# Patient Record
Sex: Female | Born: 1940 | Race: Black or African American | Hispanic: No | Marital: Single | Smoking: Former smoker
Health system: Southern US, Community
[De-identification: ages and names within clinical notes are randomized; demographics above are authoritative.]

## PROBLEM LIST (undated history)

## (undated) DIAGNOSIS — R7303 Prediabetes: Secondary | ICD-10-CM

## (undated) DIAGNOSIS — I10 Essential (primary) hypertension: Secondary | ICD-10-CM

## (undated) DIAGNOSIS — E785 Hyperlipidemia, unspecified: Secondary | ICD-10-CM

## (undated) DIAGNOSIS — R0600 Dyspnea, unspecified: Secondary | ICD-10-CM

---

## 1988-10-27 DIAGNOSIS — D860 Sarcoidosis of lung: Secondary | ICD-10-CM

## 1988-10-27 HISTORY — DX: Sarcoidosis of lung: D86.0

## 1993-10-27 HISTORY — PX: KNEE ARTHROSCOPY: SHX127

## 1998-07-29 ENCOUNTER — Emergency Department (HOSPITAL_COMMUNITY): Admission: EM | Admit: 1998-07-29 | Discharge: 1998-07-30 | Payer: Self-pay | Admitting: Emergency Medicine

## 1998-10-10 ENCOUNTER — Other Ambulatory Visit: Admission: RE | Admit: 1998-10-10 | Discharge: 1998-10-10 | Payer: Self-pay | Admitting: Otolaryngology

## 1999-05-24 ENCOUNTER — Other Ambulatory Visit: Admission: RE | Admit: 1999-05-24 | Discharge: 1999-05-24 | Payer: Self-pay | Admitting: *Deleted

## 2001-05-06 ENCOUNTER — Other Ambulatory Visit: Admission: RE | Admit: 2001-05-06 | Discharge: 2001-05-06 | Payer: Self-pay | Admitting: *Deleted

## 2001-05-11 ENCOUNTER — Encounter: Admission: RE | Admit: 2001-05-11 | Discharge: 2001-05-11 | Payer: Self-pay | Admitting: *Deleted

## 2001-05-11 ENCOUNTER — Encounter: Payer: Self-pay | Admitting: *Deleted

## 2001-09-20 ENCOUNTER — Encounter: Admission: RE | Admit: 2001-09-20 | Discharge: 2001-09-20 | Payer: Self-pay | Admitting: *Deleted

## 2001-09-20 ENCOUNTER — Encounter: Payer: Self-pay | Admitting: *Deleted

## 2003-05-23 ENCOUNTER — Encounter: Admission: RE | Admit: 2003-05-23 | Discharge: 2003-05-23 | Payer: Self-pay

## 2003-06-11 ENCOUNTER — Encounter: Admission: RE | Admit: 2003-06-11 | Discharge: 2003-06-11 | Payer: Self-pay

## 2003-06-29 ENCOUNTER — Encounter: Admission: RE | Admit: 2003-06-29 | Discharge: 2003-08-30 | Payer: Self-pay | Admitting: Orthopedic Surgery

## 2003-07-07 ENCOUNTER — Ambulatory Visit (HOSPITAL_COMMUNITY): Admission: RE | Admit: 2003-07-07 | Discharge: 2003-07-07 | Payer: Self-pay | Admitting: Gastroenterology

## 2003-07-07 ENCOUNTER — Encounter (INDEPENDENT_AMBULATORY_CARE_PROVIDER_SITE_OTHER): Payer: Self-pay | Admitting: Specialist

## 2003-08-31 ENCOUNTER — Encounter: Admission: RE | Admit: 2003-08-31 | Discharge: 2003-09-08 | Payer: Self-pay | Admitting: Surgery

## 2003-09-04 ENCOUNTER — Encounter (INDEPENDENT_AMBULATORY_CARE_PROVIDER_SITE_OTHER): Payer: Self-pay

## 2003-09-04 ENCOUNTER — Inpatient Hospital Stay (HOSPITAL_COMMUNITY): Admission: RE | Admit: 2003-09-04 | Discharge: 2003-09-08 | Payer: Self-pay | Admitting: Surgery

## 2004-12-10 ENCOUNTER — Ambulatory Visit (HOSPITAL_COMMUNITY): Admission: RE | Admit: 2004-12-10 | Discharge: 2004-12-10 | Payer: Self-pay | Admitting: Gastroenterology

## 2005-03-11 ENCOUNTER — Encounter: Admission: RE | Admit: 2005-03-11 | Discharge: 2005-03-11 | Payer: Self-pay | Admitting: Internal Medicine

## 2005-03-27 ENCOUNTER — Encounter: Admission: RE | Admit: 2005-03-27 | Discharge: 2005-06-25 | Payer: Self-pay | Admitting: Orthopedic Surgery

## 2005-08-08 ENCOUNTER — Encounter: Admission: RE | Admit: 2005-08-08 | Discharge: 2005-08-08 | Payer: Self-pay | Admitting: Internal Medicine

## 2006-11-30 ENCOUNTER — Encounter: Admission: RE | Admit: 2006-11-30 | Discharge: 2006-11-30 | Payer: Self-pay | Admitting: *Deleted

## 2007-07-25 ENCOUNTER — Encounter: Admission: RE | Admit: 2007-07-25 | Discharge: 2007-07-25 | Payer: Self-pay | Admitting: Orthopedic Surgery

## 2007-09-08 ENCOUNTER — Ambulatory Visit: Payer: Self-pay | Admitting: Vascular Surgery

## 2007-09-08 ENCOUNTER — Encounter (INDEPENDENT_AMBULATORY_CARE_PROVIDER_SITE_OTHER): Payer: Self-pay | Admitting: Orthopedic Surgery

## 2007-09-08 ENCOUNTER — Ambulatory Visit (HOSPITAL_COMMUNITY): Admission: RE | Admit: 2007-09-08 | Discharge: 2007-09-08 | Payer: Self-pay | Admitting: Orthopedic Surgery

## 2007-09-21 ENCOUNTER — Encounter: Admission: RE | Admit: 2007-09-21 | Discharge: 2007-10-19 | Payer: Self-pay | Admitting: Orthopedic Surgery

## 2010-05-21 ENCOUNTER — Encounter: Admission: RE | Admit: 2010-05-21 | Discharge: 2010-06-07 | Payer: Self-pay | Admitting: Sports Medicine

## 2010-08-15 ENCOUNTER — Encounter: Admission: RE | Admit: 2010-08-15 | Discharge: 2010-08-15 | Payer: Self-pay | Admitting: Sports Medicine

## 2011-03-14 NOTE — Op Note (Signed)
NAME:  Theresa Moses, Theresa Moses                            ACCOUNT NO.:  [id number]   MEDICAL RECORD NO.:  [id number]                   PATIENT TYPE:  AMB   LOCATION:  ENDO                                 FACILITY:  [facility]   PHYSICIAN:  John C. Madilyn Fireman, M.D.                 DATE OF BIRTH:  1941-01-23   DATE OF PROCEDURE:  07/07/2003  DATE OF DISCHARGE:                                 OPERATIVE REPORT   PROCEDURE:  Colonoscopy with biopsy.   INDICATION FOR PROCEDURE:  Colon cancer screening in a 70 year old patient  with no prior screening.   DESCRIPTION OF PROCEDURE:  The patient was placed in the left lateral  decubitus position and placed on the pulse monitor with continuous low-flow  oxygen delivered by nasal cannula.  She was sedated with 110 mcg IV fentanyl  and 11 mm IV Versed.  The Olympus video colonoscope was inserted into the  rectum and advanced to the cecum, confirmed by transillumination of  McBurney's point and visualization of the ileocecal valve and appendiceal  orifice.  The prep was good.  The cecum appeared normal with no masses,  polyps, diverticula, or other mucosal abnormalities.  The ileocecal valve  appeared somewhat granular and possibly nodular and looked to be possibly  infiltrated by adenomatous tissue.  Biopsies were taken and sent in the  first specimen container.  Approximately 5-7 cm distal to the ileocecal  valve there was a soft polypoid mass that was approximately 3 x 4 cm and no  stalk, with somewhat indistinct margin with the surrounding mucosa.  This  was biopsied and sent in a separate specimen container.  It was not hard and  my impression, it was probably an adenoma but it was possible that this  could harbor carcinoma, and it was unlikely to be removable endoscopically.  The remainder of the ascending, transverse, descending, and sigmoid colon  appeared normal with no further polyps, masses, diverticula, or other  mucosal abnormalities.  The rectum  likewise appeared normal, and retroflexed  view of the anus revealed no obvious internal hemorrhoids.  The scope was  then withdrawn and the patient returned to the recovery room in stable  condition.  She tolerated the procedure well, and there were no immediate  complications.   IMPRESSION:  1. Possible adenomatous infiltration of the ileocecal valve.  2. Ascending colon soft polypoid mass, probably not removable     endoscopically.   PLAN:  Await biopsy results and expect she will probably need a surgical  consultation for cecal and ascending colon resection.                                               John C. Madilyn Fireman, M.D.    JCH/MEDQ  D:  07/07/2003  T:  07/08/2003  Job:  [id number]   cc:   Christella Noa, M.D.  [address]  [place], [place] [address]  Fax: [phone]

## 2011-03-14 NOTE — Discharge Summary (Signed)
NAME:  Theresa Moses, Theresa Moses                            ACCOUNT NO.:  [id number]   MEDICAL RECORD NO.:  [id number]                   PATIENT TYPE:  INP   LOCATION:  0457                                 FACILITY:  [facility]   PHYSICIAN:  Velora Heckler, M.D.                DATE OF BIRTH:  October 26, 1941   DATE OF ADMISSION:  09/04/2003  DATE OF DISCHARGE:  09/08/2003                                 DISCHARGE SUMMARY   REASON FOR ADMISSION:  Colonic polyps.   HISTORY OF PRESENT ILLNESS:  Patient is a 70 year old black female who  underwent screening colonoscopy in September of 2004 by Dr. Dorena Cookey.  She  was found to have a 3 cm soft tissue mass in the mid ascending colon.  Biopsy showed tubulovillous adenoma.  This was not amenable to colonoscopic  resection.  Therefore, she was referred to surgery for right colectomy.   HOSPITAL COURSE:  The patient was admitted on September 04, 2003, and taken  directly to the operating room where she underwent exploratory laparotomy  and right colectomy.  Postoperative course was uncomplicated.  Patient  participated in a clinical trial from [employer] for a postoperative  ileus drug.  Patient started on clear liquids on the first postoperative  day.  She advanced to a regular diet.  She had a bowel movement on the third  postoperative day and was prepared for discharge home on the fourth  postoperative day.   DISCHARGE PLANNING:  Patient is discharged home on September 08, 2003 in good  condition, tolerating a regular diet, and ambulating independently.   DISCHARGE MEDICATIONS:  Vicodin as needed for pain.   Patient will be seen back at my office at [facility] in two  weeks.   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  Eight of eight lymph nodes are negative for metastatic disease.   CONDITION AT DISCHARGE:  Good.                                         Velora Heckler, M.D.    TMG/MEDQ  D:  10/06/2003  T:  10/06/2003  Job:  [id number]   cc:   Everardo All. Madilyn Fireman, M.D.  [address]  [place]  [place] [address]  Fax: [phone]

## 2011-03-14 NOTE — Op Note (Signed)
NAME:  Theresa Moses, Theresa Moses                            ACCOUNT NO.:  [id number]   MEDICAL RECORD NO.:  [id number]                   PATIENT TYPE:  INP   LOCATION:  Z610                                 FACILITY:  [facility]   PHYSICIAN:  Velora Heckler, M.D.                DATE OF BIRTH:  1941/07/23   DATE OF PROCEDURE:  09/04/2003  DATE OF DISCHARGE:                                 OPERATIVE REPORT   PREOPERATIVE DIAGNOSIS:  Adenomatous  polyps, right colon.   POSTOPERATIVE DIAGNOSIS:  Adenomatous  polyps, right colon.   PROCEDURE:  Right colectomy.   SURGEON:  Velora Heckler, M.D.   ASSISTANT:  Lebron Conners, M.D.   ANESTHESIA:  General.   ESTIMATED BLOOD LOSS:  Minimal.   PREPARATION:  Betadine.   COMPLICATIONS:  None.   INDICATIONS FOR PROCEDURE:  The patient is a 70 year old black female who  presents at the request of Dr. Dorena Cookey for polyps involving the right  colon. The patient had undergone a screening colonoscopy on July 07, 2003, by Dr. Dorena Cookey. This demonstrated a 3-cm soft tissue mass in the  mid ascending colon consistent on biopsy with tubulovillous adenoma. It was  too large for colonoscopic resection. The patient was also noted to have a  granular surface of the ileocecal valve. Biopsy showed adenomatous polyps.  The patient now comes to surgery for right colectomy.   DESCRIPTION OF PROCEDURE:  The procedure was done in operating room #1 at  the [facility]. The patient was brought to the operating  room and placed in the supine position on the operating table. Following  administration of general endotracheal anesthesia the patient was prepped  and draped in the usual strict aseptic fashion.   After ascertaining that an adequate level of anesthesia had been obtained, a  midline  abdominal incision was made with a #10 blade. Dissection was  carried down through the skin and subcutaneous tissues with the  electrocautery. The fascia  was incised in the midline and the peritoneal  cavity was entered cautiously.   The abdomen is explored. The viscera are grossly normal. A Balfour retractor  was placed for exposure. The right colon is then mobilized from its lateral  peritoneal attachments along the white line. Dissection is carried up and  around the hepatic flexure onto the mid transverse colon, allowing for  complete mobilization of the right colon. The cecum is mobilized from its  lateral  and inferior peritoneal attachments. The terminal ileum is densely  adherent to the right adnexa. It is mobilized with sharp dissection. The  ileum is then delivered up and  out of the pelvis.   A point in the terminal ileum is selected and transected with the GIA  stapler. A point in the proximal transverse colon is also selected and  transected with the GIA stapler. The  mesentery is  taken down between  clamps, divided and ligated with 2-0 silk ties. Good hemostasis is noted.   The specimen is passed off of the field and opened on the back table. The  ileocecal valve does have an irregular surface consistent with adenomatosis.  In the mid ascending colon there is a large, approximately 4-cm broad based  polypoid mass consistent with that described on endoscopy.   Next a side-to-side functionally end-to-end anastomosis is created between  the terminal ileum and the proximal transverse colon. The bowel is aligned  with interrupted 2-0 silk sutures. An enterotomy is made in each limb of the  bowel and a GIA stapler is inserted and fired. The staple line is inspected  for hemostasis. The enterotomy is closed with a TA-60 stapler. Good  integrity is noted around the staple lines with no evidence of leak. The  mesenteric defect is closed with interrupted 2-0 and 3-0 silk sutures. The  right upper quadrant and right abdomen  are irrigated copiously with warm  saline which is evacuated.   The viscera are returned to their normal  location. The anastomosis is  covered with omentum. The midline wound was closed with running #1 Novofil  suture. The subcutaneous tissue was irrigated and hemostasis was obtained  with electrocautery. The skin was closed with stainless steel staples.  Sterile dressings are applied.   The patient was awakened from anesthesia and brought to the recovery room in  stable condition. The patient tolerated the procedure well.                                               Velora Heckler, M.D.    TMG/MEDQ  D:  09/04/2003  T:  09/04/2003  Job:  [id number]   cc:   Everardo All. Madilyn Fireman, M.D.  [address]  [place]  [place] [address]  Fax: [phone]   Christella Noa, M.D.  [address]  [place], [place] [address]  Fax: [phone]   Feliberto Gottron. Turner Daniels, M.D.  [address]  [place]  [place] [address]  Fax: [phone]

## 2011-03-14 NOTE — Op Note (Signed)
NAMEGWENLYN, Theresa Moses                  ACCOUNT NO.:  [id number]   MEDICAL RECORD NO.:  [id number]          PATIENT TYPE:  AMB   LOCATION:  ENDO                         FACILITY:  [facility]   PHYSICIAN:  John C. Madilyn Fireman, M.D.    DATE OF BIRTH:  Jun 16, 1941   DATE OF PROCEDURE:  12/10/2004  DATE OF DISCHARGE:                                 OPERATIVE REPORT   PROCEDURE:  Colonoscopy.   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  Presents for surveillance.   PROCEDURE:  The patient was placed in the left lateral decubitus position  and placed on the pulse monitor with continuous low-flow oxygen delivered by  nasal cannula. She was sedated with 75 mcg IV fentanyl and 8 mg IV Versed.  The Olympus video colonoscope was inserted into the rectum and advanced to  the ileocolonic anastomosis and was confirmed by transillumination of the  right abdomen and clear visualization to the surgical anastomosis. The  anastomosis appeared intact and there were no suspicious lesions were seen.  The remaining portions of transverse well as the descending sigmoid and  rectum all appeared normal down to the anus, where retroflexed view revealed  no obvious internal hemorrhoids. The scope was then withdrawn, and the  patient returned to the recovery room in stable condition. She tolerated the  procedure well. There were no immediate complications.   IMPRESSION:  Normal colonoscopy, status post right hemicolectomy.   PLAN:  Repeat colonoscopy in 5 years.      JCH/MEDQ  D:  12/10/2004  T:  12/10/2004  Job:  [id number]   cc:   Velora Heckler, MD  [address] [place]  [place] [address]

## 2011-05-07 ENCOUNTER — Other Ambulatory Visit: Payer: Self-pay | Admitting: Otolaryngology

## 2011-05-07 ENCOUNTER — Other Ambulatory Visit (HOSPITAL_COMMUNITY)
Admission: RE | Admit: 2011-05-07 | Discharge: 2011-05-07 | Disposition: A | Discharge status: Home / Self Care | Payer: Medicare Other | Source: Ambulatory Visit | Attending: Otolaryngology | Admitting: Otolaryngology

## 2011-05-07 DIAGNOSIS — R220 Localized swelling, mass and lump, head: Secondary | ICD-10-CM | POA: Insufficient documentation

## 2011-05-07 DIAGNOSIS — R221 Localized swelling, mass and lump, neck: Secondary | ICD-10-CM | POA: Insufficient documentation

## 2011-08-12 ENCOUNTER — Other Ambulatory Visit: Payer: Self-pay | Admitting: Family Medicine

## 2011-08-12 ENCOUNTER — Ambulatory Visit
Admission: RE | Admit: 2011-08-12 | Discharge: 2011-08-12 | Disposition: A | Discharge status: Home / Self Care | Payer: Medicare Other | Source: Ambulatory Visit | Attending: Family Medicine | Admitting: Family Medicine

## 2011-08-12 DIAGNOSIS — M5410 Radiculopathy, site unspecified: Secondary | ICD-10-CM

## 2011-10-13 ENCOUNTER — Other Ambulatory Visit (INDEPENDENT_AMBULATORY_CARE_PROVIDER_SITE_OTHER): Payer: Self-pay | Admitting: Surgery

## 2011-10-14 NOTE — Telephone Encounter (Signed)
Patient seeking Rx for Miralax.  Last seen here 2008 for lipoma on back follow up.

## 2011-10-14 NOTE — Telephone Encounter (Signed)
Patient seeking Rx for Miralax.  Last seen here 2008 for lipoma on back.

## 2013-11-24 ENCOUNTER — Other Ambulatory Visit: Payer: Self-pay | Admitting: Family Medicine

## 2013-11-24 ENCOUNTER — Ambulatory Visit
Admission: RE | Admit: 2013-11-24 | Discharge: 2013-11-24 | Disposition: A | Discharge status: Home / Self Care | Payer: Medicare Other | Source: Ambulatory Visit | Attending: Family Medicine | Admitting: Family Medicine

## 2013-11-24 DIAGNOSIS — D869 Sarcoidosis, unspecified: Secondary | ICD-10-CM

## 2014-04-25 ENCOUNTER — Other Ambulatory Visit: Payer: Self-pay | Admitting: Family Medicine

## 2014-04-25 DIAGNOSIS — M79606 Pain in leg, unspecified: Secondary | ICD-10-CM

## 2014-04-27 ENCOUNTER — Ambulatory Visit
Admission: RE | Admit: 2014-04-27 | Discharge: 2014-04-27 | Disposition: A | Discharge status: Home / Self Care | Payer: Medicare Other | Source: Ambulatory Visit | Attending: Family Medicine | Admitting: Family Medicine

## 2014-04-27 DIAGNOSIS — M79606 Pain in leg, unspecified: Secondary | ICD-10-CM

## 2016-01-21 ENCOUNTER — Ambulatory Visit
Admission: RE | Admit: 2016-01-21 | Discharge: 2016-01-21 | Disposition: A | Discharge status: Home / Self Care | Payer: Commercial Managed Care - HMO | Source: Ambulatory Visit | Attending: Family Medicine | Admitting: Family Medicine

## 2016-01-21 ENCOUNTER — Other Ambulatory Visit: Payer: Self-pay | Admitting: Family Medicine

## 2016-01-21 DIAGNOSIS — R05 Cough: Secondary | ICD-10-CM

## 2016-01-21 DIAGNOSIS — R059 Cough, unspecified: Secondary | ICD-10-CM

## 2016-02-19 ENCOUNTER — Ambulatory Visit (INDEPENDENT_AMBULATORY_CARE_PROVIDER_SITE_OTHER): Payer: Commercial Managed Care - HMO | Admitting: Internal Medicine

## 2016-02-19 ENCOUNTER — Encounter: Payer: Self-pay | Admitting: Internal Medicine

## 2016-02-19 VITALS — BP 124/78 | HR 74 | Ht 65.5 in | Wt 235.8 lb

## 2016-02-19 DIAGNOSIS — I10 Essential (primary) hypertension: Secondary | ICD-10-CM

## 2016-02-19 DIAGNOSIS — R058 Other specified cough: Secondary | ICD-10-CM

## 2016-02-19 DIAGNOSIS — D869 Sarcoidosis, unspecified: Secondary | ICD-10-CM

## 2016-02-19 DIAGNOSIS — R05 Cough: Secondary | ICD-10-CM

## 2016-02-19 MED ORDER — VALSARTAN 160 MG PO TABS: 160.0000 mg | ORAL_TABLET | Freq: Every day | ORAL | Status: DC

## 2016-02-19 NOTE — Patient Instructions (Signed)
Stop enalapril and start valsartan 160 mg daily and your throat congestion should gradually resolve over next several weeks and gone completely in 6 weeks   If you are satisfied with your treatment plan,  let your doctor know and he/she can either refill your medications or you can return here when your prescription runs out.     If in any way you are not 100% satisfied,  please tell us.  If 100% better, tell your friends!  Pulmonary follow up is as needed

## 2016-02-19 NOTE — Assessment & Plan Note (Signed)
Dx 1980s and never treated - a good rule is that if not needing prednisone at presentation, then never will will chronic rx   Another  good rule of thumb is that >95% of pts with active sarcoid in any organ will have some plain cxr changes - on the other hand  if there are active pulmonary symptoms the cxr will look much worse than the patient:  No evidence of either scenario here/ strongly doubt active dz     Strongly doubt any of her present symptoms are related to sarcoid > f/u is prn

## 2016-02-19 NOTE — Assessment & Plan Note (Signed)
The most common causes of chronic cough in immunocompetent adults include the following: upper airway cough syndrome (UACS), previously referred to as postnasal drip syndrome (PNDS), which is caused by variety of rhinosinus conditions; (2) asthma; (3) GERD; (4) chronic bronchitis from cigarette smoking or other inhaled environmental irritants; (5) nonasthmatic eosinophilic bronchitis; and (6) bronchiectasis.   These conditions, singly or in combination, have accounted for up to 94% of the causes of chronic cough in prospective studies.   Other conditions have constituted no >6% of the causes in prospective studies These have included bronchogenic carcinoma, chronic interstitial pneumonia, sarcoidosis, left ventricular failure, ACEI-induced cough, and aspiration from a condition associated with pharyngeal dysfunction.    Chronic cough is often simultaneously caused by more than one condition. A single cause has been found from 38 to 82% of the time, multiple causes from 18 to 62%. Multiply caused cough has been the result of three diseases up to 42% of the time.       Based on hx and exam, this is most likely:  Classic Upper airway cough syndrome, so named because it's frequently impossible to sort out how much is  CR/sinusitis with freq throat clearing (which can be related to primary GERD)   vs  causing  secondary (" extra esophageal")  GERD from wide swings in gastric pressure that occur with throat clearing, often  promoting self use of mint and menthol lozenges that reduce the lower esophageal sphincter tone and exacerbate the problem further in a cyclical fashion.   These are the same pts (now being labeled as having "irritable larynx syndrome" by some cough centers) who not infrequently have a history of having failed to tolerate ace inhibitors,  dry powder inhalers or biphosphonates or report having atypical reflux symptoms that don't respond to standard doses of PPI , and are easily confused as  having aecopd or asthma flares by even experienced allergists/ pulmonologists.   The first step is to   and eliminate ACEi  then regroup in 6 weeks if the cough persists.  If this proves correct, then there may be an opportunity to simplify her meds further but defer this to Dr Fulp's capable hands

## 2016-02-19 NOTE — Progress Notes (Signed)
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 Pulmonary Consult    Referred by Dr. Cain Saupe for eval of Sarcoid. Pt states dxed with Sarcoid in the 80's- she can not recall how it was dxed. She c/o cough x 6 wks- prod with thick, white to yellow sputum.    acutely ill x 6 weeks with severe cough/ abrupt onset and improved p rx with abx / pred/ combivent but still lingering sense of throat and chest congestion while on vasotec chronically.  Symptoms are 24/7 s limiting sob at present  No obvious other patterns in day to day or daytime variabilty or assoc   cp or chest tightness, subjective wheeze overt sinus or hb symptoms. No unusual exp hx or h/o childhood pna/ asthma or knowledge of premature birth.  Sleeping ok without nocturnal  or early am exacerbation  of respiratory  c/o's or need for noct saba. Also denies any obvious fluctuation of symptoms with weather or environmental changes or other aggravating or alleviating factors except as outlined above   Current Medications, Allergies, Complete Past Medical History, Past Surgical History, Family History, and Social History were reviewed in [employer] record.           Review of Systems  Constitutional: Negative for fever, chills and unexpected weight change.  HENT: Positive for congestion. Negative for dental problem, ear pain, nosebleeds, postnasal drip, rhinorrhea, sinus pressure, sneezing, sore throat, trouble swallowing and voice change.   Eyes: Negative for visual disturbance.  Respiratory: Positive for cough. Negative for choking and  shortness of breath.   Cardiovascular: Negative for chest pain and leg swelling.  Gastrointestinal: Negative for vomiting, abdominal pain and diarrhea.  Genitourinary: Negative for difficulty urinating.  Musculoskeletal: Positive for arthralgias.  Skin: Negative for rash.  Neurological: Negative for tremors, syncope and headaches.  Hematological: Does not bruise/bleed easily.       Objective:   Physical Exam  amb bf nad/ mild pseudowheeze  Wt Readings from Last 3 Encounters:  02/19/16 235 lb 12.8 oz (106.958 kg)    Vital signs reviewed   HEENT: nl dentition, turbinates, and oropharynx. Nl external ear canals without cough reflex   NECK :  without JVD/Nodes/TM/ nl carotid upstrokes bilaterally   LUNGS: no acc muscle use,  Nl contour chest which is clear to A and P bilaterally without cough on insp or exp maneuvers   CV:  RRR  no s3 or murmur or increase in P2, no edema   ABD:  soft and nontender with nl inspiratory excursion in the supine position. No bruits or organomegaly, bowel sounds nl  MS:  Nl gait/ ext warm without deformities, calf tenderness, cyanosis or clubbing No obvious joint restrictions   SKIN: warm and dry without lesions    NEURO:  alert, approp, nl sensorium with  no motor deficits      I personally reviewed images and agree with radiology impression as follows:  CXR:  01/21/16 Cardiac shadow is stable. Some mild hilar fullness is again identified likely related to the patient's given clinical  history. Degenerative changes of thoracic spine are noted. No acute bony abnormality is seen         Assessment & Plan:

## 2016-02-19 NOTE — Assessment & Plan Note (Signed)
In the best review of chronic cough to date ( NEJM 2016 375 [id number]) ,  ACEi are now felt to cause cough in up to  20% of pts which is a 4 fold increase from previous reports and does not include the variety of non-specific complaints we see in pulmonary clinic in pts on ACEi but previously attributed to another dx like  Copd/asthma and  include PNDS, throat and chest congestion, "bronchitis", unexplained dyspnea and noct "strangling" sensations, and hoarseness, but also  atypical /refractory GERD symptoms like dysphagia and "bad heartburn"   The only way I know  to prove this is not an "ACEi Case" is a trial off ACEi x a minimum of 6 weeks then regroup.  Try diovan 160 mg daily and return here prn

## 2016-11-01 ENCOUNTER — Encounter (HOSPITAL_COMMUNITY): Payer: Self-pay | Admitting: Emergency Medicine

## 2016-11-01 ENCOUNTER — Emergency Department (HOSPITAL_COMMUNITY): Payer: Medicare Other

## 2016-11-01 ENCOUNTER — Emergency Department (HOSPITAL_COMMUNITY)
Admission: EM | Admit: 2016-11-01 | Discharge: 2016-11-01 | Disposition: A | Discharge status: Home / Self Care | Payer: Medicare Other | Attending: Emergency Medicine | Admitting: Emergency Medicine

## 2016-11-01 DIAGNOSIS — Z79899 Other long term (current) drug therapy: Secondary | ICD-10-CM | POA: Insufficient documentation

## 2016-11-01 DIAGNOSIS — Z91040 Latex allergy status: Secondary | ICD-10-CM | POA: Diagnosis not present

## 2016-11-01 DIAGNOSIS — R05 Cough: Secondary | ICD-10-CM | POA: Insufficient documentation

## 2016-11-01 DIAGNOSIS — I10 Essential (primary) hypertension: Secondary | ICD-10-CM | POA: Insufficient documentation

## 2016-11-01 DIAGNOSIS — Z7982 Long term (current) use of aspirin: Secondary | ICD-10-CM | POA: Insufficient documentation

## 2016-11-01 DIAGNOSIS — Z87891 Personal history of nicotine dependence: Secondary | ICD-10-CM | POA: Diagnosis not present

## 2016-11-01 DIAGNOSIS — R059 Cough, unspecified: Secondary | ICD-10-CM

## 2016-11-01 HISTORY — DX: Essential (primary) hypertension: I10

## 2016-11-01 MED ORDER — BENZONATATE 100 MG PO CAPS: 100.0000 mg | ORAL_CAPSULE | Freq: Three times a day (TID) | ORAL | 0 refills | Status: DC

## 2016-11-01 MED ORDER — AZITHROMYCIN 250 MG PO TABS: 250.0000 mg | ORAL_TABLET | Freq: Every day | ORAL | 0 refills | Status: DC

## 2016-11-01 NOTE — Discharge Instructions (Signed)
Only start the antibiotic if you are not better by tomorrow

## 2016-11-01 NOTE — ED Notes (Signed)
Patient was A & O x4.  She understood discharge instructions.   

## 2016-11-01 NOTE — ED Triage Notes (Signed)
Pt complaint of continued generalized abdominal pain, diaphoresis, and productive cough since Thursday. Pt denies n/v/d.

## 2016-11-01 NOTE — ED Provider Notes (Signed)
[facility]-EMERGENCY DEPT Provider Note   CSN: [id number] Arrival date & time: 11/01/16  0805     History   Chief Complaint Chief Complaint  Patient presents with  . Flu Like Symptoms    HPI Theresa Moses is a 76 y.o. female.  76 year old female presents with cough congestion 3 days. No associated vomiting or diarrhea. Some subjective fever. No urinary symptoms. No headache or neck pain. Has had some dyspnea on exertion but denies any chest pain or chest pressure. Has had positive sick exposures. No rashes. No recent travel history. Has used over-the-counter medications without relief.       Past Medical History:  Diagnosis Date  . Hypertension     Patient Active Problem List   Diagnosis Date Noted  . Upper airway cough syndrome 02/19/2016  . Essential hypertension 02/19/2016  . Sarcoidosis (HCC) 02/19/2016    History reviewed. No pertinent surgical history.  OB History    No data available       Home Medications    Prior to Admission medications   Medication Sig Start Date End Date Taking? Authorizing Provider  aspirin 81 MG tablet Take 81 mg by mouth daily.    Historical Provider, MD  atorvastatin (LIPITOR) 40 MG tablet Take 40 mg by mouth daily.    Historical Provider, MD  Calcium Carb-Cholecalciferol (CALCIUM PLUS VITAMIN D3 PO) Take 1 tablet by mouth daily.    Historical Provider, MD  cetirizine (ZYRTEC) 10 MG tablet Take 10 mg by mouth daily.    Historical Provider, MD  fluticasone (FLONASE) 50 MCG/ACT nasal spray Place 2 sprays into both nostrils daily as needed for allergies or rhinitis.    Historical Provider, MD  Garlic (GARLIQUE PO) Take 1 Can by mouth daily.    Historical Provider, MD  glucosamine-chondroitin 500-400 MG tablet Take 1 tablet by mouth daily.    Historical Provider, MD  hydrochlorothiazide (MICROZIDE) 12.5 MG capsule Take 12.5 mg by mouth daily.    Historical Provider, MD  Ipratropium-Albuterol (COMBIVENT RESPIMAT) 20-100 MCG/ACT AERS  respimat Inhale 1 puff into the lungs every 6 (six) hours as needed for wheezing.    Historical Provider, MD  montelukast (SINGULAIR) 10 MG tablet Take 10 mg by mouth at bedtime.    Historical Provider, MD  Multiple Vitamins-Minerals (CENTRUM SILVER PO) Take 1 tablet by mouth daily.    Historical Provider, MD  Omega-3 Fatty Acids (FISH OIL PO) Take 1 capsule by mouth daily.    Historical Provider, MD  valsartan (DIOVAN) 160 MG tablet Take 1 tablet (160 mg total) by mouth daily. 02/19/16   Nyoka Cowden, MD    Family History Family History  Problem Relation Age of Onset  . Breast cancer Sister     Social History Social History  Substance Use Topics  . Smoking status: Former Smoker    Packs/day: 0.25    Years: 6.00    Types: Cigarettes    Quit date: 10/28/1995  . Smokeless tobacco: Never Used  . Alcohol use No     Allergies   Latex and Neosporin [neomycin-polymyxin-gramicidin]   Review of Systems Review of Systems  All other systems reviewed and are negative.    Physical Exam Updated Vital Signs BP 111/84 (BP Location: Left Arm)   Pulse 100   Temp 97.9 F (36.6 C) (Oral)   Resp 20   Ht 5\' 5"  (1.651 m)   Wt 109.3 kg   SpO2 94%   BMI 40.10 kg/m   Physical Exam  Constitutional: She is oriented to person, place, and time. She appears well-developed and well-nourished.  Non-toxic appearance. No distress.  HENT:  Head: Normocephalic and atraumatic.  Eyes: Conjunctivae, EOM and lids are normal. Pupils are equal, round, and reactive to light.  Neck: Normal range of motion. Neck supple. No tracheal deviation present. No thyroid mass present.  Cardiovascular: Normal rate, regular rhythm and normal heart sounds.  Exam reveals no gallop.   No murmur heard. Pulmonary/Chest: Effort normal and breath sounds normal. No stridor. No respiratory distress. She has no decreased breath sounds. She has no wheezes. She has no rhonchi. She has no rales.  Abdominal: Soft. Normal  appearance and bowel sounds are normal. She exhibits no distension. There is no tenderness. There is no rebound and no CVA tenderness.  Musculoskeletal: Normal range of motion. She exhibits no edema or tenderness.  Neurological: She is alert and oriented to person, place, and time. She has normal strength. No cranial nerve deficit or sensory deficit. GCS eye subscore is 4. GCS verbal subscore is 5. GCS motor subscore is 6.  Skin: Skin is warm and dry. No abrasion and no rash noted.  Psychiatric: She has a normal mood and affect. Her speech is normal and behavior is normal.  Nursing note and vitals reviewed.    ED Treatments / Results  Labs (all labs ordered are listed, but only abnormal results are displayed) Labs Reviewed - No data to display  EKG  EKG Interpretation None       Radiology Dg Chest 2 View  Result Date: 11/01/2016 CLINICAL DATA:  Pt complaint of continued generalized abdominal pain, diaphoresis, and productive cough since Thursday. Pt denies n/v/d. Hx of asthma. EXAM: CHEST  2 VIEW COMPARISON:  01/21/2016 FINDINGS: The cardiac silhouette is normal in size. No mediastinal or hilar masses. No evidence of adenopathy. Clear lungs.  No pleural effusion or pneumothorax. Skeletal structures are demineralized but intact. IMPRESSION: No active cardiopulmonary disease. Electronically Signed   By: Amie Portlandavid  Ormond M.D.   On: 11/01/2016 08:30    Procedures Procedures (including critical care time)  Medications Ordered in ED Medications - No data to display   Initial Impression / Assessment and Plan / ED Course  I have reviewed the triage vital signs and the nursing notes.  Pertinent labs & imaging results that were available during my care of the patient were reviewed by me and considered in my medical decision making (see chart for details).  Clinical Course     Patient's chest x-ray is negative. Will give patient prescription for antibiotic in case she gets worse. Also  treat her cough with antitussives  Final Clinical Impressions(s) / ED Diagnoses   Final diagnoses:  None    New Prescriptions New Prescriptions   No medications on file     Lorre NickAnthony Karsyn Rochin, MD 11/01/16 [phone]

## 2018-01-11 IMAGING — CR DG CHEST 2V
2 series · 2 of 2 positions shown · non-contrast
Comparison: 01/21/2016

CLINICAL DATA: Pt complaint of continued generalized abdominal
pain, diaphoresis, and productive cough since [REDACTED]. Pt denies
n/v/d. Hx of asthma.

EXAM:
CHEST  2 VIEW

[w chest pa]
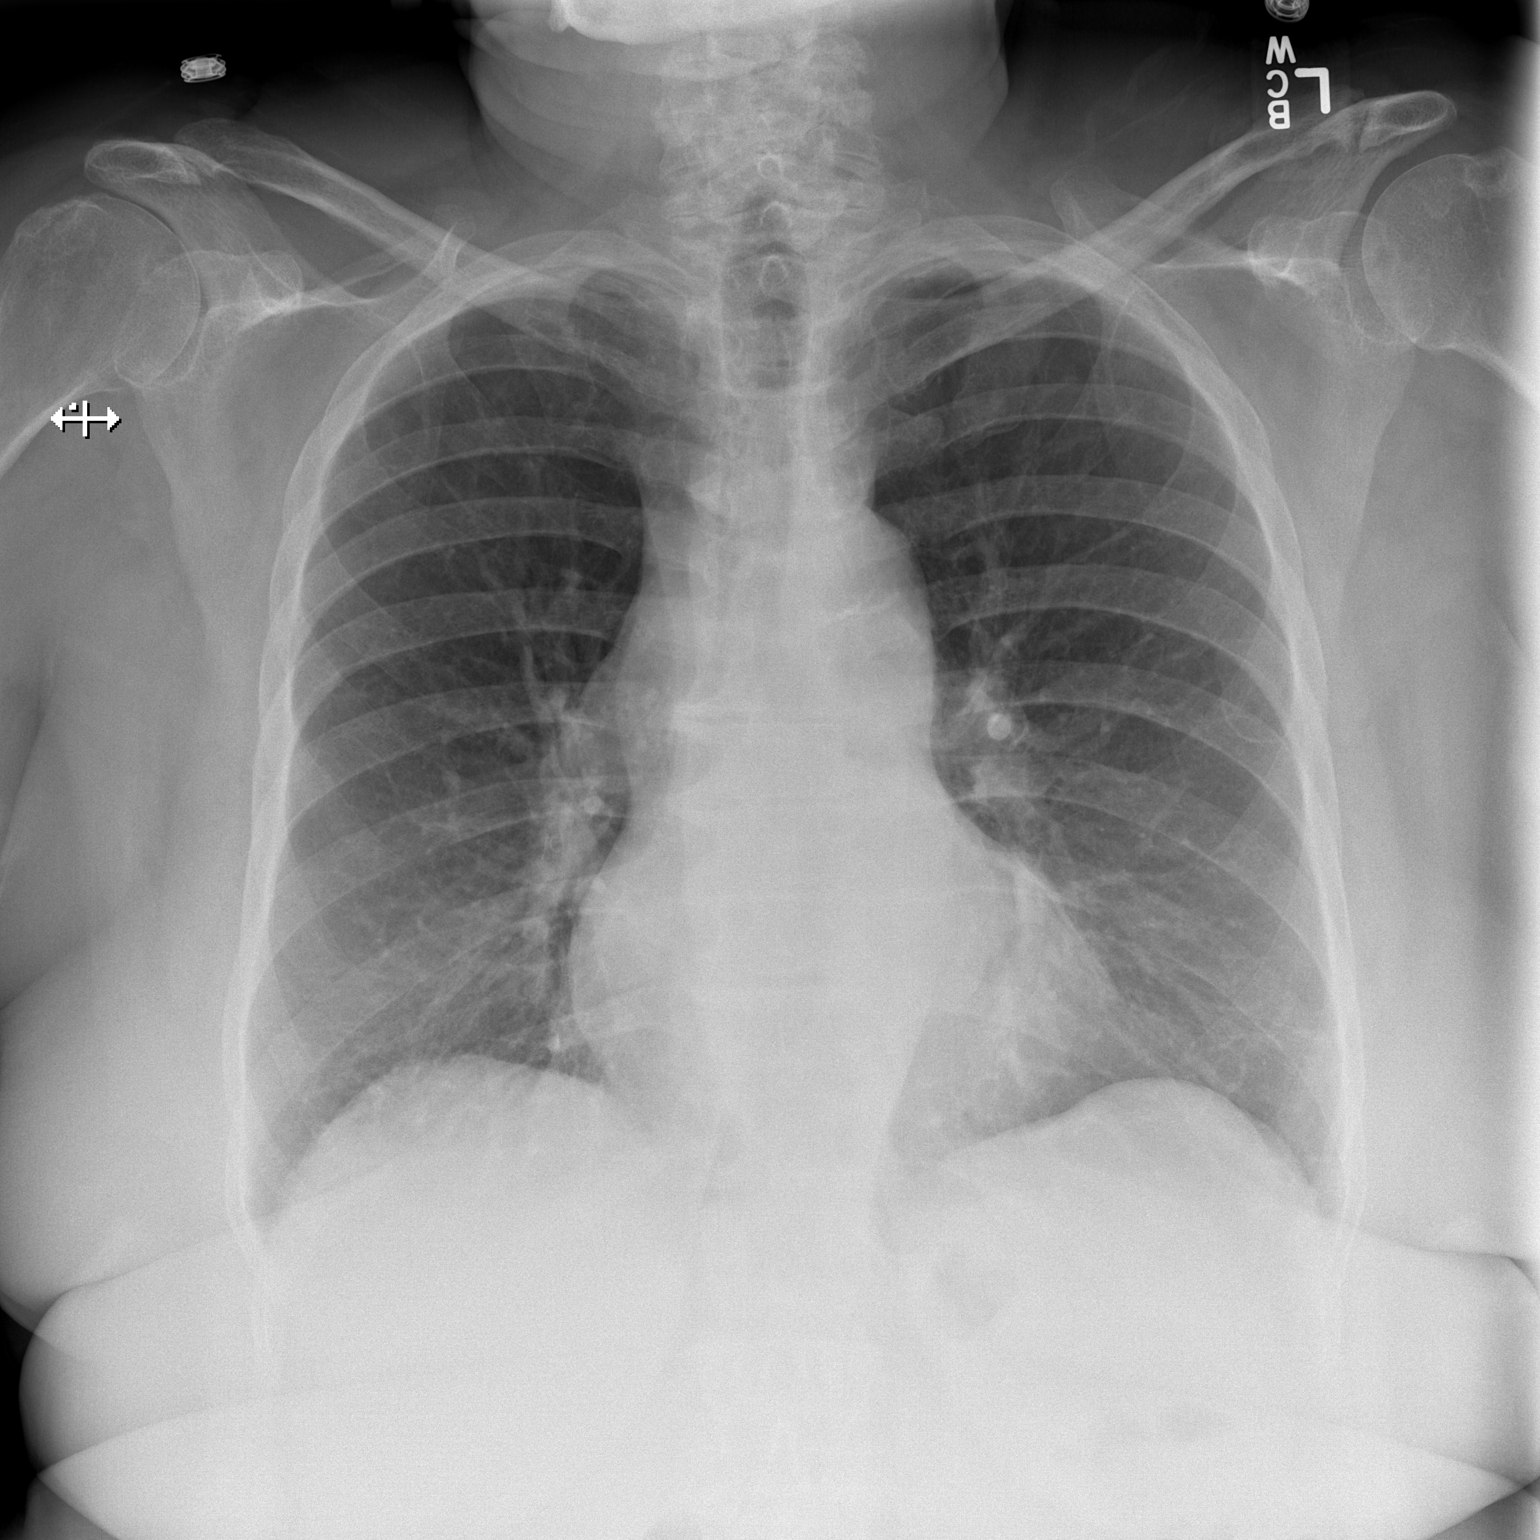

[w chest lat]
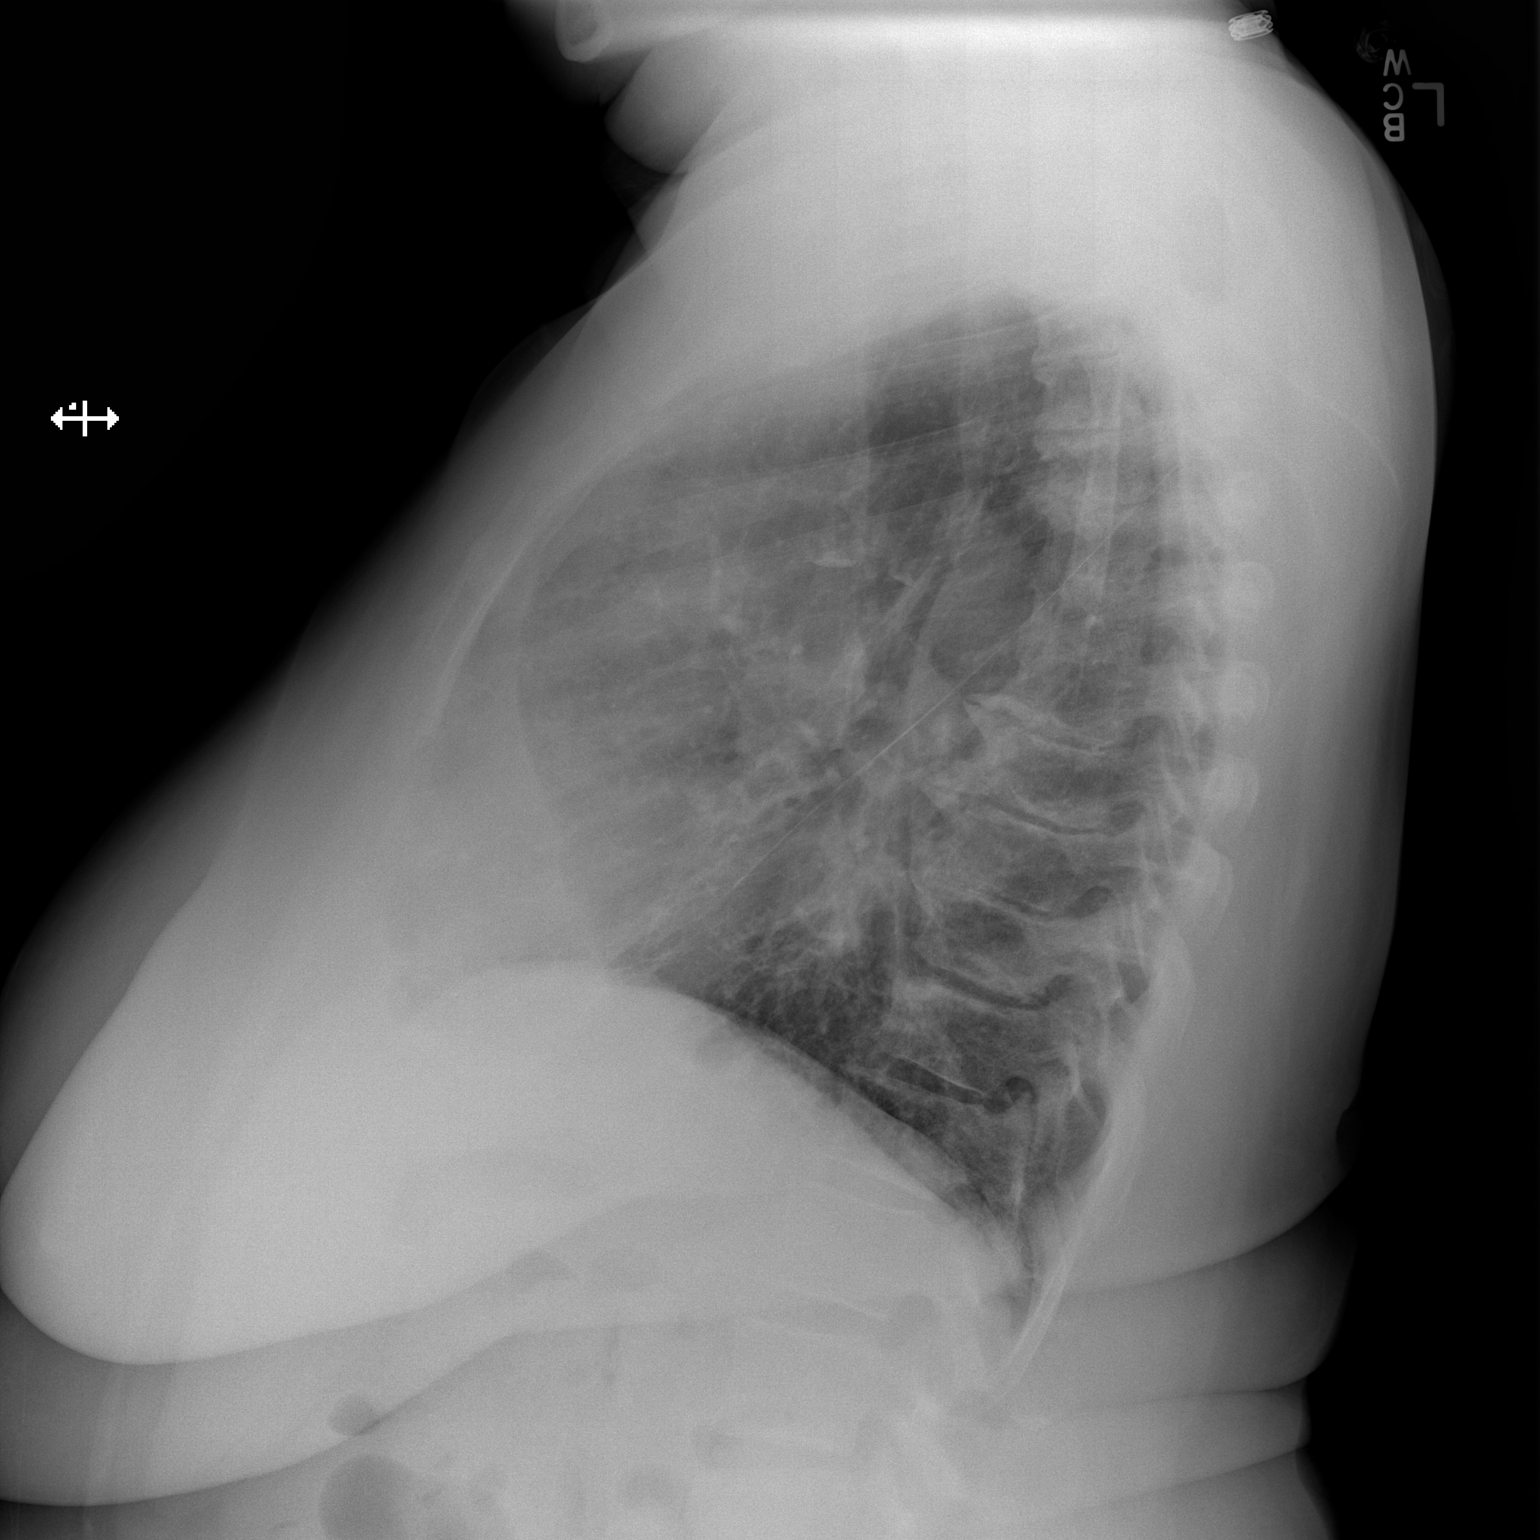

[2 of 2 positions shown; findings below may reference images not displayed]

FINDINGS: The cardiac silhouette is normal in size. No mediastinal or hilar
masses. No evidence of adenopathy.

Clear lungs.  No pleural effusion or pneumothorax.

Skeletal structures are demineralized but intact.
IMPRESSION: No active cardiopulmonary disease.

## 2018-10-13 ENCOUNTER — Other Ambulatory Visit: Payer: Self-pay | Admitting: Family Medicine

## 2018-10-13 ENCOUNTER — Ambulatory Visit
Admission: RE | Admit: 2018-10-13 | Discharge: 2018-10-13 | Disposition: A | Discharge status: Home / Self Care | Payer: Medicare Other | Source: Ambulatory Visit | Attending: Family Medicine | Admitting: Family Medicine

## 2018-10-13 DIAGNOSIS — R059 Cough, unspecified: Secondary | ICD-10-CM

## 2018-10-13 DIAGNOSIS — D869 Sarcoidosis, unspecified: Secondary | ICD-10-CM

## 2018-10-13 DIAGNOSIS — R05 Cough: Secondary | ICD-10-CM

## 2019-04-27 HISTORY — PX: TOE SURGERY: SHX1073

## 2019-08-09 ENCOUNTER — Other Ambulatory Visit: Payer: Self-pay

## 2019-08-09 DIAGNOSIS — Z20822 Contact with and (suspected) exposure to covid-19: Secondary | ICD-10-CM

## 2019-08-11 ENCOUNTER — Telehealth: Payer: Self-pay | Admitting: Family Medicine

## 2019-08-11 LAB — NOVEL CORONAVIRUS, NAA: SARS-CoV-2, NAA: NOT DETECTED

## 2019-08-11 NOTE — Telephone Encounter (Signed)
Negative COVID results given. Patient results "NOT Detected." Caller expressed understanding. ° °

## 2019-09-01 ENCOUNTER — Other Ambulatory Visit: Payer: Self-pay | Admitting: Family Medicine

## 2019-09-01 ENCOUNTER — Ambulatory Visit
Admission: RE | Admit: 2019-09-01 | Discharge: 2019-09-01 | Disposition: A | Discharge status: Home / Self Care | Payer: Medicare Other | Source: Ambulatory Visit | Attending: Family Medicine | Admitting: Family Medicine

## 2019-09-01 DIAGNOSIS — M545 Low back pain, unspecified: Secondary | ICD-10-CM

## 2019-09-29 ENCOUNTER — Other Ambulatory Visit: Payer: Self-pay

## 2019-09-29 DIAGNOSIS — Z20822 Contact with and (suspected) exposure to covid-19: Secondary | ICD-10-CM

## 2019-10-03 LAB — NOVEL CORONAVIRUS, NAA: SARS-CoV-2, NAA: NOT DETECTED

## 2019-10-12 ENCOUNTER — Other Ambulatory Visit: Payer: Self-pay

## 2019-10-12 ENCOUNTER — Ambulatory Visit: Payer: Medicare Other | Attending: Internal Medicine

## 2019-10-12 DIAGNOSIS — Z20822 Contact with and (suspected) exposure to covid-19: Secondary | ICD-10-CM

## 2019-10-13 LAB — NOVEL CORONAVIRUS, NAA: SARS-CoV-2, NAA: NOT DETECTED

## 2019-10-24 ENCOUNTER — Encounter: Payer: Self-pay | Admitting: Neurology

## 2019-10-28 HISTORY — PX: COLONOSCOPY: SHX174

## 2019-11-21 ENCOUNTER — Encounter: Payer: Self-pay | Admitting: Neurology

## 2019-11-21 ENCOUNTER — Ambulatory Visit: Payer: Medicare Other | Admitting: Neurology

## 2019-11-21 ENCOUNTER — Other Ambulatory Visit: Payer: Self-pay

## 2019-11-21 VITALS — BP 145/91 | HR 90 | Ht 66.0 in | Wt 233.0 lb

## 2019-11-21 DIAGNOSIS — E1142 Type 2 diabetes mellitus with diabetic polyneuropathy: Secondary | ICD-10-CM | POA: Diagnosis not present

## 2019-11-21 MED ORDER — DULOXETINE HCL 30 MG PO CPEP: 30.0000 mg | ORAL_CAPSULE | Freq: Every day | ORAL | 5 refills | Status: DC

## 2019-11-21 NOTE — Progress Notes (Signed)
[facility] Clinic Note - Initial Visit   Date: 11/21/19  Theresa Moses MRN: [id number] DOB: Mar 11, 1941   Dear Dr. Valentina Lucks:  Thank you for your kind referral of Theresa Moses for consultation of diabetic neuropathy. Although her history is well known to you, please allow [place] to reiterate it for the purpose of our medical record. The patient was accompanied to the clinic by self.    History of Present Illness: Theresa Moses is a 79 y.o. right-handed female with very well controlled type 2 diabetes mellitus, pulmonary sarcoidosis, hyperlipidemia, hypertension, osteoarthritis, and chronic low back pain presenting for evaluation of diabetic neuropathy.   Starting around early 2020, she began noticing numbness and tingling over the toes and top of the feet.  Symptoms are constant, without any exacerbating.  She has noticed that socks at bedtime helps.  She tried gabapentin but it did not provide any relief.  She denies imbalance, falls, or weakness.  No numbness/tingling in the hands.   She has chronic low back pain and recently completed PT which helped.  She denies radicular leg pain. She has been using a cane intermittently for the past two years, after her right knee surgery.    Out-side paper records, electronic medical record, and images have been reviewed where available and summarized as:  Labs 04/19/2019: Hemoglobin A1c 6.9, creatinine 1.1, GFR 58*LFTs within normal limits   Past Medical History:  Diagnosis Date  . Hypertension     Past Surgical History:  Procedure Laterality Date  . TOE SURGERY Bilateral 04/2019     Medications:  Outpatient Encounter Medications as of 11/21/2019  Medication Sig  . aspirin 81 MG tablet Take 81 mg by mouth daily.  Marland Kitchen atorvastatin (LIPITOR) 40 MG tablet Take 40 mg by mouth daily.  . Calcium Carb-Cholecalciferol (CALCIUM PLUS VITAMIN D3 PO) Take 1 tablet by mouth daily.  . cetirizine (ZYRTEC) 10 MG tablet Take 10 mg  by mouth daily.  . cyanocobalamin 500 MCG tablet Take 500 mcg by mouth daily.  . fluticasone (FLONASE) 50 MCG/ACT nasal spray Place 2 sprays into both nostrils daily as needed for allergies or rhinitis.  . Garlic (GARLIQUE PO) Take 1 Can by mouth daily.  Marland Kitchen glucosamine-chondroitin 500-400 MG tablet Take 1 tablet by mouth daily.  . hydrochlorothiazide (MICROZIDE) 12.5 MG capsule Take 12.5 mg by mouth daily.  . montelukast (SINGULAIR) 10 MG tablet Take 10 mg by mouth at bedtime.  . Multiple Vitamins-Minerals (CENTRUM SILVER PO) Take 1 tablet by mouth daily.  Marland Kitchen olmesartan (BENICAR) 20 MG tablet Take 20 mg by mouth daily.  . Omega-3 Fatty Acids (FISH OIL PO) Take 1 capsule by mouth daily.  . polyethylene glycol (MIRALAX / GLYCOLAX) 17 g packet Take 17 g by mouth as needed.  . psyllium (METAMUCIL) 58.6 % packet Take 1 packet by mouth as needed.  . [DISCONTINUED] azithromycin (ZITHROMAX) 250 MG tablet Take 1 tablet (250 mg total) by mouth daily. Take first 2 tablets together, then 1 every day until finished.  . [DISCONTINUED] benzonatate (TESSALON) 100 MG capsule Take 1 capsule (100 mg total) by mouth every 8 (eight) hours.  . [DISCONTINUED] doxycycline (VIBRA-TABS) 100 MG tablet Take 200 mg by mouth at bedtime.  . [DISCONTINUED] gabapentin (NEURONTIN) 300 MG capsule Take 600 mg by mouth at bedtime.  . [DISCONTINUED] Ipratropium-Albuterol (COMBIVENT RESPIMAT) 20-100 MCG/ACT AERS respimat Inhale 1 puff into the lungs every 6 (six) hours as needed for wheezing.  . [DISCONTINUED] valsartan (DIOVAN) 160  MG tablet Take 1 tablet (160 mg total) by mouth daily.   No facility-administered encounter medications on file as of 11/21/2019.    Allergies:  Allergies  Allergen Reactions  . Latex Hives  . Neosporin [Neomycin-Polymyxin-Gramicidin] Hives    Family History: Family History  Problem Relation Age of Onset  . Breast cancer Sister     Social History: Social History   Tobacco Use  . Smoking  status: Former Smoker    Packs/day: 0.25    Years: 6.00    Pack years: 1.50    Types: Cigarettes    Quit date: 10/28/1995    Years since quitting: 24.0  . Smokeless tobacco: Never Used  Substance Use Topics  . Alcohol use: No    Alcohol/week: 0.0 standard drinks  . Drug use: No   Social History   Social History Narrative   Right handed      Completed 11th grade    Vital Signs:  BP (!) 145/91   Pulse 90   Ht 5\' 6"  (1.676 m)   Wt 233 lb (105.7 kg)   SpO2 95%   BMI 37.61 kg/m    Neurological Exam: MENTAL STATUS including orientation to time, place, person, recent and remote memory, attention span and concentration, language, and fund of knowledge is normal.  Speech is not dysarthric.  CRANIAL NERVES: II:  No visual field defects.     III-IV-VI: Pupils equal round and reactive to light.  Normal conjugate, extra-ocular eye movements in all directions of gaze.  No nystagmus.  No ptosis.   V:  Normal facial sensation.    VII:  Normal facial symmetry and movements.   VIII:  Normal hearing and vestibular function.   IX-X:  Normal palatal movement.   XI:  Normal shoulder shrug and head rotation.   XII:  Normal tongue strength and range of motion, no deviation or fasciculation.  MOTOR:  No atrophy, fasciculations or abnormal movements.  No pronator drift.   Upper Extremity:  Right  Left  Deltoid  5/5   5/5   Biceps  5/5   5/5   Triceps  5/5   5/5   Infraspinatus 5/5  5/5  Medial pectoralis 5/5  5/5  Wrist extensors  5/5   5/5   Wrist flexors  5/5   5/5   Finger extensors  5/5   5/5   Finger flexors  5/5   5/5   Dorsal interossei  5/5   5/5   Abductor pollicis  5/5   5/5   Tone (Ashworth scale)  0  0   Lower Extremity:  Right  Left  Hip flexors  5/5   5/5   Hip extensors  5/5   5/5   Adductor 5/5  5/5  Abductor 5/5  5/5  Knee flexors  5/5   5/5   Knee extensors  5/5   5/5   Dorsiflexors  5/5   5/5   Plantarflexors  5/5   5/5   Toe extensors  5/5   5/5   Toe  flexors  5/5   5/5   Tone (Ashworth scale)  0  0   MSRs:  Right        Left                  brachioradialis 2+  2+  biceps 2+  2+  triceps 2+  2+  patellar 2+  2+  ankle jerk 0  0  Hoffman no  no  plantar response down  down   SENSORY: Vibration is reduced by around 20% at the great toe.  Intact at the ankles.  Temperature and pinprick is intact throughout.  Romberg's sign absent.   COORDINATION/GAIT: Normal finger-to- nose-finger.  Gait is mildly wide-based, stable, unassisted.  She is able to perform heel and toe walking.  There is unsteadiness with tandem gait, but she is able to do this.     IMPRESSION: Neuropathy manifesting with distal numbness and tingling, contributed by age and diabetes.  She has very well controlled diabetes on lifestyle modification alone, hemoglobin A1c is 6.9.  Her neurological examination shows a distal predominant large fiber peripheral neuropathy. I had extensive discussion with the patient regarding the pathogenesis, etiology, management, and natural course of neuropathy. Neuropathy tends to be slowly progressive and management is symptomatic.     PLAN/RECOMMENDATIONS:  Start Cymbalta 30mg  daily NCS/EMG of the legs, if symptom progress Patient educated on daily foot inspection, fall prevention, and safety precautions around the home.  Call with update in 3 months  Return to clinic in 6 months.   Thank you for allowing me to participate in patient's care.  If I can answer any additional questions, I would be pleased to do so.    Sincerely,    Lagretta Loseke K. Posey Pronto, DO

## 2019-11-21 NOTE — Patient Instructions (Signed)
Start Cymbalta 30mg  daily Check feet daily Take extra caution in the shower and on uneven ground If your pain gets worse, please contact my office and we can schedule nerve testing  Call with an update in 2-3 months  Return to clinic in September 2021

## 2020-05-22 ENCOUNTER — Other Ambulatory Visit: Payer: Self-pay | Admitting: Neurology

## 2020-07-05 ENCOUNTER — Ambulatory Visit: Payer: Medicare Other | Admitting: Neurology

## 2020-08-10 ENCOUNTER — Encounter: Payer: Self-pay | Admitting: Neurology

## 2020-08-10 ENCOUNTER — Other Ambulatory Visit: Payer: Self-pay

## 2020-08-10 ENCOUNTER — Ambulatory Visit: Payer: Medicare Other | Admitting: Neurology

## 2020-08-10 VITALS — BP 138/71 | HR 92 | Ht 66.0 in | Wt 236.0 lb

## 2020-08-10 DIAGNOSIS — E1142 Type 2 diabetes mellitus with diabetic polyneuropathy: Secondary | ICD-10-CM | POA: Diagnosis not present

## 2020-08-10 MED ORDER — DULOXETINE HCL 60 MG PO CPEP: 60.0000 mg | ORAL_CAPSULE | Freq: Every day | ORAL | 3 refills | Status: DC

## 2020-08-10 NOTE — Patient Instructions (Signed)
Increase Cymbalta to 60mg  at bedtime  Return to clinic in 1 year

## 2020-08-10 NOTE — Progress Notes (Signed)
Follow-up Visit   Date: 08/10/20   Theresa Moses MRN: [id number] DOB: 1941-05-29   Interim History: Theresa Moses is a 79 y.o. right-handed female with very well controlled type 2 diabetes mellitus, pulmonary sarcoidosis, hyperlipidemia, hypertension, osteoarthritis, and chronic low back pain  returning to the clinic for follow-up of neuropathy.  The patient was accompanied to the clinic by self.  History of present illness: Starting around early 2020, she began noticing numbness and tingling over the toes and top of the feet.  Symptoms are constant, without any exacerbating.  She has noticed that socks at bedtime helps.  She tried gabapentin but it did not provide any relief.  She denies imbalance, falls, or weakness.  No numbness/tingling in the hands.   She has chronic low back pain and recently completed PT which helped.  She denies radicular leg pain. She has been using a cane intermittently for the past two years, after her right knee surgery.    UPDATE 08/10/2020:  She is here for 6 month follow-up visit.  Her neuropathy has been stable and continues to involve the toes and the top of the feet.  She did notice very mild improvement with Cymbalta and denies any adverse effects.  No interval falls, hospitalizations, illness.  No new complaints.  Diabetes remains well controlled.  Medications:  Current Outpatient Medications on File Prior to Visit  Medication Sig Dispense Refill  . aspirin 81 MG tablet Take 81 mg by mouth daily.    Marland Kitchen atorvastatin (LIPITOR) 40 MG tablet Take 40 mg by mouth daily.    . Calcium Carb-Cholecalciferol (CALCIUM PLUS VITAMIN D3 PO) Take 1 tablet by mouth daily.    . cetirizine (ZYRTEC) 10 MG tablet Take 10 mg by mouth daily.    . cyanocobalamin 500 MCG tablet Take 500 mcg by mouth daily.    . DULoxetine (CYMBALTA) 30 MG capsule TAKE 1 CAPSULE(30 MG) BY MOUTH AT BEDTIME 30 capsule 5  . fluticasone (FLONASE) 50 MCG/ACT nasal spray Place 2 sprays into  both nostrils daily as needed for allergies or rhinitis.    . Garlic (GARLIQUE PO) Take 1 Can by mouth daily.    Marland Kitchen glucosamine-chondroitin 500-400 MG tablet Take 1 tablet by mouth daily.    . hydrochlorothiazide (MICROZIDE) 12.5 MG capsule Take 12.5 mg by mouth daily.    . montelukast (SINGULAIR) 10 MG tablet Take 10 mg by mouth at bedtime.    . Multiple Vitamins-Minerals (CENTRUM SILVER PO) Take 1 tablet by mouth daily.    Marland Kitchen olmesartan (BENICAR) 20 MG tablet Take 20 mg by mouth daily.    . Omega-3 Fatty Acids (FISH OIL PO) Take 1 capsule by mouth daily.    . polyethylene glycol (MIRALAX / GLYCOLAX) 17 g packet Take 17 g by mouth as needed.    . psyllium (METAMUCIL) 58.6 % packet Take 1 packet by mouth as needed.     No current facility-administered medications on file prior to visit.    Allergies:  Allergies  Allergen Reactions  . Latex Hives  . Neosporin [Neomycin-Polymyxin-Gramicidin] Hives    Vital Signs:  BP 138/71   Pulse 92   Ht 5\' 6"  (1.676 m)   Wt 236 lb (107 kg)   SpO2 92%   BMI 38.09 kg/m   Neurological Exam: MENTAL STATUS including orientation to time, place, person, recent and remote memory, attention span and concentration, language, and fund of knowledge is normal.  Speech is not dysarthric.  CRANIAL NERVES:  No visual field defects.  Pupils equal round and reactive.  Normal conjugate, extra-ocular eye movements in all directions of gaze.  No ptosis   MOTOR:  Motor strength is 5/5 in all extremities, including distally.  No atrophy, fasciculations or abnormal movements.  No pronator drift.  Tone is normal.    MSRs:  Reflexes are 2+/4 throughout, except absent at the ankles bilaterally.  SENSORY: Vibration is reduced at the great toe bilaterally, intact at the ankles.  Temperature is intact throughout.Marland Kitchen  COORDINATION/GAIT: Gait is mildly wide-based, assisted with a cane.  Data: n/a   IMPRESSION/PLAN: Diabetic polyneuropathy manifesting with distal  paresthesias of the feet.  She does not have adequate pain relief with Cymbalta 30mg , so we will increase the dose to 60 mg at bedtime. Patient educated on daily foot inspection, fall prevention, and safety precautions around the home.  Return to clinic in 1 year.   Thank you for allowing me to participate in patient's care.  If I can answer any additional questions, I would be pleased to do so.    Sincerely,    Chrishawn Boley K. , DO

## 2020-11-06 DIAGNOSIS — Z1152 Encounter for screening for COVID-19: Secondary | ICD-10-CM | POA: Diagnosis not present

## 2020-11-07 DIAGNOSIS — B349 Viral infection, unspecified: Secondary | ICD-10-CM | POA: Diagnosis not present

## 2020-11-28 DIAGNOSIS — Z803 Family history of malignant neoplasm of breast: Secondary | ICD-10-CM | POA: Diagnosis not present

## 2020-11-28 DIAGNOSIS — Z1231 Encounter for screening mammogram for malignant neoplasm of breast: Secondary | ICD-10-CM | POA: Diagnosis not present

## 2021-03-05 DIAGNOSIS — M5030 Other cervical disc degeneration, unspecified cervical region: Secondary | ICD-10-CM | POA: Diagnosis not present

## 2021-03-05 DIAGNOSIS — M159 Polyosteoarthritis, unspecified: Secondary | ICD-10-CM | POA: Diagnosis not present

## 2021-03-05 DIAGNOSIS — M5136 Other intervertebral disc degeneration, lumbar region: Secondary | ICD-10-CM | POA: Diagnosis not present

## 2021-03-05 DIAGNOSIS — M5134 Other intervertebral disc degeneration, thoracic region: Secondary | ICD-10-CM | POA: Diagnosis not present

## 2021-03-18 DIAGNOSIS — M545 Low back pain, unspecified: Secondary | ICD-10-CM | POA: Diagnosis not present

## 2021-03-18 DIAGNOSIS — M4696 Unspecified inflammatory spondylopathy, lumbar region: Secondary | ICD-10-CM | POA: Diagnosis not present

## 2021-03-29 DIAGNOSIS — M47816 Spondylosis without myelopathy or radiculopathy, lumbar region: Secondary | ICD-10-CM | POA: Diagnosis not present

## 2021-04-11 DIAGNOSIS — M47816 Spondylosis without myelopathy or radiculopathy, lumbar region: Secondary | ICD-10-CM | POA: Diagnosis not present

## 2021-05-03 DIAGNOSIS — M47816 Spondylosis without myelopathy or radiculopathy, lumbar region: Secondary | ICD-10-CM | POA: Diagnosis not present

## 2021-05-10 DIAGNOSIS — M545 Low back pain, unspecified: Secondary | ICD-10-CM | POA: Diagnosis not present

## 2021-05-14 DIAGNOSIS — M47816 Spondylosis without myelopathy or radiculopathy, lumbar region: Secondary | ICD-10-CM | POA: Diagnosis not present

## 2021-06-19 DIAGNOSIS — M47816 Spondylosis without myelopathy or radiculopathy, lumbar region: Secondary | ICD-10-CM | POA: Diagnosis not present

## 2021-06-21 DIAGNOSIS — E785 Hyperlipidemia, unspecified: Secondary | ICD-10-CM | POA: Diagnosis not present

## 2021-06-21 DIAGNOSIS — D860 Sarcoidosis of lung: Secondary | ICD-10-CM | POA: Diagnosis not present

## 2021-06-21 DIAGNOSIS — E1169 Type 2 diabetes mellitus with other specified complication: Secondary | ICD-10-CM | POA: Diagnosis not present

## 2021-06-21 DIAGNOSIS — E1142 Type 2 diabetes mellitus with diabetic polyneuropathy: Secondary | ICD-10-CM | POA: Diagnosis not present

## 2021-06-21 DIAGNOSIS — M159 Polyosteoarthritis, unspecified: Secondary | ICD-10-CM | POA: Diagnosis not present

## 2021-06-21 DIAGNOSIS — I10 Essential (primary) hypertension: Secondary | ICD-10-CM | POA: Diagnosis not present

## 2021-06-21 DIAGNOSIS — Z85038 Personal history of other malignant neoplasm of large intestine: Secondary | ICD-10-CM | POA: Diagnosis not present

## 2021-06-21 DIAGNOSIS — J309 Allergic rhinitis, unspecified: Secondary | ICD-10-CM | POA: Diagnosis not present

## 2021-06-21 DIAGNOSIS — M545 Low back pain, unspecified: Secondary | ICD-10-CM | POA: Diagnosis not present

## 2021-06-21 DIAGNOSIS — Z1389 Encounter for screening for other disorder: Secondary | ICD-10-CM | POA: Diagnosis not present

## 2021-06-21 DIAGNOSIS — Z0000 Encounter for general adult medical examination without abnormal findings: Secondary | ICD-10-CM | POA: Diagnosis not present

## 2021-07-10 DIAGNOSIS — M47816 Spondylosis without myelopathy or radiculopathy, lumbar region: Secondary | ICD-10-CM | POA: Diagnosis not present

## 2021-07-23 DIAGNOSIS — M47816 Spondylosis without myelopathy or radiculopathy, lumbar region: Secondary | ICD-10-CM | POA: Diagnosis not present

## 2021-07-29 DIAGNOSIS — H524 Presbyopia: Secondary | ICD-10-CM | POA: Diagnosis not present

## 2021-07-29 DIAGNOSIS — H43812 Vitreous degeneration, left eye: Secondary | ICD-10-CM | POA: Diagnosis not present

## 2021-07-29 DIAGNOSIS — H04123 Dry eye syndrome of bilateral lacrimal glands: Secondary | ICD-10-CM | POA: Diagnosis not present

## 2021-07-29 DIAGNOSIS — Z961 Presence of intraocular lens: Secondary | ICD-10-CM | POA: Diagnosis not present

## 2021-08-12 ENCOUNTER — Other Ambulatory Visit: Payer: Self-pay

## 2021-08-12 ENCOUNTER — Encounter: Payer: Self-pay | Admitting: Neurology

## 2021-08-12 ENCOUNTER — Ambulatory Visit: Payer: Medicare Other | Admitting: Neurology

## 2021-08-12 VITALS — BP 146/79 | HR 90 | Ht 65.0 in | Wt 235.0 lb

## 2021-08-12 DIAGNOSIS — E1142 Type 2 diabetes mellitus with diabetic polyneuropathy: Secondary | ICD-10-CM | POA: Diagnosis not present

## 2021-08-12 MED ORDER — DULOXETINE HCL 30 MG PO CPEP: 30.0000 mg | ORAL_CAPSULE | Freq: Every day | ORAL | 3 refills | Status: DC

## 2021-08-12 MED ORDER — DULOXETINE HCL 60 MG PO CPEP: 60.0000 mg | ORAL_CAPSULE | Freq: Every day | ORAL | 3 refills | Status: DC

## 2021-08-12 NOTE — Progress Notes (Signed)
Follow-up Visit   Date: 08/12/21   Theresa Moses MRN: [id number] DOB: 04-Nov-1940   Interim History: Theresa Moses is a 80 y.o. right-handed female with very well controlled type 2 diabetes mellitus, pulmonary sarcoidosis, hyperlipidemia, hypertension, osteoarthritis, and chronic low back pain  returning to the clinic for follow-up of neuropathy.  The patient was accompanied to the clinic by self.  History of present illness: Starting around early 2020, she began noticing numbness and tingling over the toes and top of the feet.  Symptoms are constant, without any exacerbating.  She has noticed that socks at bedtime helps.  She tried gabapentin but it did not provide any relief.  She denies imbalance, falls, or weakness.  No numbness/tingling in the hands.    She has chronic low back pain and recently completed PT which helped.  She denies radicular leg pain. She has been using a cane intermittently for the past two years, after her right knee surgery.     UPDATE 08/10/2020:  She is here for 6 month follow-up visit.  Her neuropathy has been stable and continues to involve the toes and the top of the feet.  She did notice very mild improvement with Cymbalta and denies any adverse effects.   UPDATE 08/12/2021:  She is here for 1 year follow-up.  Since increased her Cymbalta to 60mg , her pain is not as intense or occurring daily.  She notices much better pain relief as compared with gabapentin. However, she continues to have spells where the pain has be burning.  No falls or illnesses.  Balance is good.  Diabetes is well-controlled.   Medications:  Current Outpatient Medications on File Prior to Visit  Medication Sig Dispense Refill   aspirin 81 MG tablet Take 81 mg by mouth daily.     atorvastatin (LIPITOR) 40 MG tablet Take 40 mg by mouth daily.     cetirizine (ZYRTEC) 10 MG tablet Take 10 mg by mouth daily.     cyanocobalamin 500 MCG tablet Take 500 mcg by mouth daily.     DULoxetine  (CYMBALTA) 60 MG capsule Take 1 capsule (60 mg total) by mouth at bedtime. 90 capsule 3   fluticasone (FLONASE) 50 MCG/ACT nasal spray Place 2 sprays into both nostrils daily as needed for allergies or rhinitis.     Garlic (GARLIQUE PO) Take 1 Can by mouth daily.     glucosamine-chondroitin 500-400 MG tablet Take 1 tablet by mouth daily.     hydrochlorothiazide (MICROZIDE) 12.5 MG capsule Take 12.5 mg by mouth daily.     montelukast (SINGULAIR) 10 MG tablet Take 10 mg by mouth at bedtime.     Multiple Vitamins-Minerals (CENTRUM SILVER PO) Take 1 tablet by mouth daily.     olmesartan (BENICAR) 20 MG tablet Take 20 mg by mouth daily.     Omega-3 Fatty Acids (FISH OIL PO) Take 1 capsule by mouth daily.     polyethylene glycol (MIRALAX / GLYCOLAX) 17 g packet Take 17 g by mouth as needed.     psyllium (METAMUCIL) 58.6 % packet Take 1 packet by mouth as needed.     Calcium Carb-Cholecalciferol (CALCIUM PLUS VITAMIN D3 PO) Take 1 tablet by mouth daily. (Patient not taking: Reported on 08/12/2021)     No current facility-administered medications on file prior to visit.    Allergies:  Allergies  Allergen Reactions   Latex Hives   Neosporin [Neomycin-Polymyxin-Gramicidin] Hives    Vital Signs:  BP (!) 146/79  Pulse 90   Ht 5\' 5"  (1.651 m)   Wt 235 lb (106.6 kg)   SpO2 95%   BMI 39.11 kg/m   Neurological Exam: MENTAL STATUS including orientation to time, place, person, recent and remote memory, attention span and concentration, language, and fund of knowledge is normal.  Speech is not dysarthric.  CRANIAL NERVES:   Pupils equal round and reactive.  Normal conjugate, extra-ocular eye movements in all directions of gaze.  No ptosis   MOTOR:  Motor strength is 5/5 in all extremities, exam of LUE limited due to left shoulder pain.  No atrophy, fasciculations or abnormal movements.  No pronator drift.  Tone is normal.    MSRs:  Reflexes are 2+/4 throughout, except absent at the ankles  bilaterally.  SENSORY: Vibration is reduced at the great toe bilaterally, intact at the ankles.    COORDINATION/GAIT: Gait is mildly wide-based, unassisted, more stable with using a cane.  Data: n/a   IMPRESSION/PLAN: Diabetic polyneuropathy manifesting with distal paresthesias of the feet.  Pain is better controlled on Cymbalta, but she would like to optimize this further because of breakthrough pain.  I will increase Cymbalta to 90mg /d.  Return to clinic in 1 year.   Thank you for allowing me to participate in patient's care.  If I can answer any additional questions, I would be pleased to do so.    Sincerely,    Abigal Choung K. , DO

## 2021-08-12 NOTE — Patient Instructions (Signed)
Increase duloxetine to 90mg  daily.  Return to clinic in 1 year

## 2021-08-14 DIAGNOSIS — M19012 Primary osteoarthritis, left shoulder: Secondary | ICD-10-CM | POA: Diagnosis not present

## 2021-08-21 DIAGNOSIS — M47816 Spondylosis without myelopathy or radiculopathy, lumbar region: Secondary | ICD-10-CM | POA: Diagnosis not present

## 2021-09-24 DIAGNOSIS — M47816 Spondylosis without myelopathy or radiculopathy, lumbar region: Secondary | ICD-10-CM | POA: Diagnosis not present

## 2021-10-07 ENCOUNTER — Other Ambulatory Visit: Payer: Self-pay | Admitting: Neurology

## 2021-10-07 MED ORDER — DULOXETINE HCL 30 MG PO CPEP: 30.0000 mg | ORAL_CAPSULE | Freq: Every day | ORAL | 1 refills | Status: DC

## 2021-10-07 MED ORDER — DULOXETINE HCL 60 MG PO CPEP: 60.0000 mg | ORAL_CAPSULE | Freq: Every day | ORAL | 1 refills | Status: DC

## 2021-10-07 NOTE — Telephone Encounter (Signed)
1. Which medications need refilled? (List name and dosage, if known) duloxetine 30MG   2. Which pharmacy/location is medication to be sent to? (include street and city if local pharmacy) [facility], [phone]  3. Do they need a 30 day or 90 day supply? 90

## 2021-10-30 DIAGNOSIS — M19112 Post-traumatic osteoarthritis, left shoulder: Secondary | ICD-10-CM | POA: Diagnosis not present

## 2021-11-08 ENCOUNTER — Other Ambulatory Visit: Payer: Self-pay | Admitting: Orthopedic Surgery

## 2021-11-20 NOTE — Patient Instructions (Addendum)
DUE TO COVID-19 ONLY ONE VISITOR IS ALLOWED TO COME WITH YOU AND STAY IN THE WAITING ROOM ONLY DURING PRE OP AND PROCEDURE DAY OF SURGERY IF YOU ARE GOING HOME AFTER SURGERY. IF YOU ARE SPENDING THE NIGHT 2 PEOPLE MAY VISIT WITH YOU IN YOUR PRIVATE ROOM AFTER SURGERY UNTIL VISITING  HOURS ARE OVER AT 800 PM AND 1  VISITOR  MAY  SPEND THE NIGHT.                 Theresa Moses     Your procedure is scheduled on: 12/05/21   Report to [facility] Main  Entrance   Report to short stay at 5:15 AM     Call this number if you have problems the morning of surgery [phone]    No food after midnight.    You may have clear liquid until 4:30 AM.    At 4:15 AM drink pre surgery drink.   Nothing by mouth after 4:30 AM.     CLEAR LIQUID DIET   Foods Allowed                                                                     Foods Excluded  Coffee and tea, regular and decaf                             liquids that you cannot  Plain Jell-O any favor except red or purple                                           see through such as: Fruit ices (not with fruit pulp)                                     milk, soups, orange juice  Iced Popsicles                                    All solid food Carbonated beverages, regular and diet                                    Cranberry, grape and apple juices Sports drinks like Gatorade Lightly seasoned clear broth or consume(fat free) Sugar     BRUSH YOUR TEETH MORNING OF SURGERY AND RINSE YOUR MOUTH OUT, NO CHEWING GUM CANDY OR MINTS.     Take these medicines the morning of surgery with A SIP OF WATER: none                                You may not have any metal on your body including hair pins and              piercings  Do not wear jewelry, make-up, lotions, powders or perfumes, deodorant  Do not wear nail polish on your fingernails.  Do not shave  48 hours prior to surgery.               Do not bring valuables to the  hospital. [facility] IS NOT             RESPONSIBLE   FOR VALUABLES.  Contacts, dentures or bridgework may not be worn into surgery.     Patients discharged the day of surgery will not be allowed to drive home.  IF YOU ARE HAVING SURGERY AND GOING HOME THE SAME DAY, YOU MUST HAVE AN ADULT TO DRIVE YOU HOME AND BE WITH YOU FOR 24 HOURS. YOU MAY GO HOME BY TAXI OR UBER OR ORTHERWISE, BUT AN ADULT MUST ACCOMPANY YOU HOME AND STAY WITH YOU FOR 24 HOURS.  Name and phone number of your driver:  Special Instructions: N/A              Please read over the following fact sheets you were given: _____________________________________________________________________  [facility]    Before surgery, you can play an important role. Because skin is not sterile, your skin needs to be as free of germs as possible. You can reduce the number of germs on your skin by using the following products. Benzoyl Peroxide Gel Reduces the number of germs present on the skin Applied twice a day to shoulder area starting two days before surgery    ==================================================================  Please follow these instructions carefully:  BENZOYL PEROXIDE 5% GEL  Please do not use if you have an allergy to benzoyl peroxide.   If your skin becomes reddened/irritated stop using the benzoyl peroxide.  Starting two days before surgery, apply as follows: Apply benzoyl peroxide in the morning and at night. Apply after taking a shower. If you are not taking a shower clean entire shoulder front, back, and side along with the armpit with a clean wet washcloth.  Place a quarter-sized dollop on your shoulder and rub in thoroughly, making sure to cover the front, back, and side of your shoulder, along with the armpit.   2 days before ____ AM   ____ PM              1 day before ____ AM   ____ PM                         Do this twice a day for two days.  (Last  application is the night before surgery, AFTER using the CHG soap as described below).  Do NOT apply benzoyl peroxide gel on the day of surgery.            [facility] - Preparing for Surgery Before surgery, you can play an important role.  Because skin is not sterile, your skin needs to be as free of germs as possible.  You can reduce the number of germs on your skin by washing with CHG (chlorahexidine gluconate) soap before surgery.  CHG is an antiseptic cleaner which kills germs and bonds with the skin to continue killing germs even after washing. Please DO NOT use if you have an allergy to CHG or antibacterial soaps.  If your skin becomes reddened/irritated stop using the CHG and inform your nurse when you arrive at Short Stay. Do not shave (including legs and underarms) for at least 48 hours prior to the first CHG shower.  Please follow these instructions carefully:  1.  Shower with CHG Soap the night before surgery and the  morning of Surgery.  2.  If you choose to wash your hair, wash your hair first as usual with your  normal  shampoo.  3.  After you shampoo, rinse your hair and body thoroughly to remove the  shampoo.                            4.  Use CHG as you would any other liquid soap.  You can apply chg directly  to the skin and wash                       Gently with a scrungie or clean washcloth.  5.  Apply the CHG Soap to your body ONLY FROM THE NECK DOWN.   Do not use on face/ open                           Wound or open sores. Avoid contact with eyes, ears mouth and genitals (private parts).                       Wash face,  Genitals (private parts) with your normal soap.             6.  Wash thoroughly, paying special attention to the area where your surgery  will be performed.  7.  Thoroughly rinse your body with warm water from the neck down.  8.  DO NOT shower/wash with your normal soap after using and rinsing off  the CHG Soap.             9.  Pat yourself dry with a clean  towel.            10.  Wear clean pajamas.            11.  Place clean sheets on your bed the night of your first shower and do not  sleep with pets. Day of Surgery : Do not apply any lotions/deodorants the morning of surgery.  Please wear clean clothes to the hospital/surgery center.  FAILURE TO FOLLOW THESE INSTRUCTIONS MAY RESULT IN THE CANCELLATION OF YOUR SURGERY PATIENT SIGNATURE_________________________________  NURSE SIGNATURE__________________________________  ________________________________________________________________________

## 2021-11-21 ENCOUNTER — Ambulatory Visit (HOSPITAL_COMMUNITY)
Admission: RE | Admit: 2021-11-21 | Discharge: 2021-11-21 | Disposition: A | Discharge status: Home / Self Care | Payer: Medicare Other | Source: Ambulatory Visit | Attending: Orthopedic Surgery | Admitting: Orthopedic Surgery

## 2021-11-21 ENCOUNTER — Encounter (HOSPITAL_COMMUNITY)
Admission: RE | Admit: 2021-11-21 | Discharge: 2021-11-21 | Disposition: A | Discharge status: Home / Self Care | Payer: Medicare Other | Source: Ambulatory Visit | Attending: Orthopedic Surgery | Admitting: Orthopedic Surgery

## 2021-11-21 ENCOUNTER — Encounter (HOSPITAL_COMMUNITY): Payer: Self-pay

## 2021-11-21 ENCOUNTER — Other Ambulatory Visit: Payer: Self-pay

## 2021-11-21 VITALS — BP 154/79 | HR 73 | Temp 98.5°F | Resp 20 | Ht 67.0 in | Wt 237.0 lb

## 2021-11-21 DIAGNOSIS — J9859 Other diseases of mediastinum, not elsewhere classified: Secondary | ICD-10-CM | POA: Diagnosis not present

## 2021-11-21 DIAGNOSIS — R7303 Prediabetes: Secondary | ICD-10-CM | POA: Insufficient documentation

## 2021-11-21 DIAGNOSIS — Z01811 Encounter for preprocedural respiratory examination: Secondary | ICD-10-CM

## 2021-11-21 DIAGNOSIS — Z01818 Encounter for other preprocedural examination: Secondary | ICD-10-CM | POA: Insufficient documentation

## 2021-11-21 DIAGNOSIS — R918 Other nonspecific abnormal finding of lung field: Secondary | ICD-10-CM | POA: Diagnosis not present

## 2021-11-21 DIAGNOSIS — I10 Essential (primary) hypertension: Secondary | ICD-10-CM | POA: Diagnosis not present

## 2021-11-21 HISTORY — DX: Dyspnea, unspecified: R06.00

## 2021-11-21 HISTORY — DX: Hyperlipidemia, unspecified: E78.5

## 2021-11-21 HISTORY — DX: Prediabetes: R73.03

## 2021-11-21 LAB — BASIC METABOLIC PANEL
Anion gap: 5 (ref 5–15)
BUN: 17 mg/dL (ref 8–23)
CO2: 30 mmol/L (ref 22–32)
Calcium: 9.1 mg/dL (ref 8.9–10.3)
Chloride: 104 mmol/L (ref 98–111)
Creatinine, Ser: 0.93 mg/dL (ref 0.44–1.00)
GFR, Estimated: >60 mL/min (ref >60)
Glucose, Bld: 119 mg/dL — ABNORMAL HIGH (ref 70–99)
Potassium: 4.3 mmol/L (ref 3.5–5.1)
Sodium: 139 mmol/L (ref 135–145)

## 2021-11-21 LAB — CBC
HCT: 37.5 % (ref 36.0–46.0)
Hemoglobin: 11.9 g/dL — ABNORMAL LOW (ref 12.0–15.0)
MCH: 27.9 pg (ref 26.0–34.0)
MCHC: 31.7 g/dL (ref 30.0–36.0)
MCV: 88 fL (ref 80.0–100.0)
Platelets: 437 10*3/uL — ABNORMAL HIGH (ref 150–400)
RBC: 4.26 MIL/uL (ref 3.87–5.11)
RDW: 15.5 % (ref 11.5–15.5)
WBC: 5 10*3/uL (ref 4.0–10.5)
nRBC: 0 % (ref 0.0–0.2)

## 2021-11-21 LAB — GLUCOSE, CAPILLARY: Glucose-Capillary: 118 mg/dL — ABNORMAL HIGH (ref 70–99)

## 2021-11-21 LAB — TYPE AND SCREEN
ABO/RH(D): A POS
Antibody Screen: NEGATIVE

## 2021-11-21 LAB — SURGICAL PCR SCREEN
MRSA, PCR: NEGATIVE
Staphylococcus aureus: NEGATIVE

## 2021-11-21 NOTE — Progress Notes (Signed)
COVID test- NA    PCP - Dr. Massie Maroon Cardiologist - none  Chest x-ray - 11/21/21-epic EKG - 11/21/21-chart Stress Test - no ECHO - no Cardiac Cath - no Pacemaker/ICD device last checked:NA  Sleep Study - no CPAP -   Fasting Blood Sugar - Diet controlled diabetic Checks Blood Sugar _____ times a day  Blood Thinner Instructions:ASA 81 mg Aspirin Instructions:none Last Dose:  Anesthesia review: yes  Patient denies shortness of breath, fever, cough and chest pain at PAT appointment Pt has Sarcoidosis of the lung but reports no problems. She does get SOB climbing stairs but not doing housework or with ADLs. She can't afford her inhaler and doesn't have one.  Patient verbalized understanding of instructions that were given to them at the PAT appointment. Patient was also instructed that they will need to review over the PAT instructions again at home before surgery. yes

## 2021-11-22 LAB — HEMOGLOBIN A1C
Hgb A1c MFr Bld: 7.1 % — ABNORMAL HIGH (ref 4.8–5.6)
Mean Plasma Glucose: 157 mg/dL

## 2021-12-05 ENCOUNTER — Ambulatory Visit (HOSPITAL_COMMUNITY): Payer: Medicare Other | Admitting: Physician Assistant

## 2021-12-05 ENCOUNTER — Ambulatory Visit (HOSPITAL_COMMUNITY)
Admission: RE | Admit: 2021-12-05 | Discharge: 2021-12-05 | Disposition: A | Discharge status: Home / Self Care | Payer: Medicare Other | Source: Ambulatory Visit | Attending: Orthopedic Surgery | Admitting: Orthopedic Surgery

## 2021-12-05 ENCOUNTER — Encounter (HOSPITAL_COMMUNITY)
Admission: RE | Disposition: A | Discharge status: Home / Self Care | Payer: Self-pay | Source: Ambulatory Visit | Attending: Orthopedic Surgery

## 2021-12-05 ENCOUNTER — Ambulatory Visit (HOSPITAL_BASED_OUTPATIENT_CLINIC_OR_DEPARTMENT_OTHER): Payer: Medicare Other | Admitting: Certified Registered"

## 2021-12-05 ENCOUNTER — Ambulatory Visit (HOSPITAL_COMMUNITY): Payer: Medicare Other

## 2021-12-05 DIAGNOSIS — Z96642 Presence of left artificial hip joint: Secondary | ICD-10-CM | POA: Diagnosis not present

## 2021-12-05 DIAGNOSIS — Z87891 Personal history of nicotine dependence: Secondary | ICD-10-CM | POA: Diagnosis not present

## 2021-12-05 DIAGNOSIS — Z79899 Other long term (current) drug therapy: Secondary | ICD-10-CM | POA: Diagnosis not present

## 2021-12-05 DIAGNOSIS — E785 Hyperlipidemia, unspecified: Secondary | ICD-10-CM | POA: Insufficient documentation

## 2021-12-05 DIAGNOSIS — M19012 Primary osteoarthritis, left shoulder: Secondary | ICD-10-CM

## 2021-12-05 DIAGNOSIS — Z471 Aftercare following joint replacement surgery: Secondary | ICD-10-CM | POA: Diagnosis not present

## 2021-12-05 DIAGNOSIS — M75102 Unspecified rotator cuff tear or rupture of left shoulder, not specified as traumatic: Secondary | ICD-10-CM | POA: Diagnosis not present

## 2021-12-05 DIAGNOSIS — Z96612 Presence of left artificial shoulder joint: Secondary | ICD-10-CM

## 2021-12-05 DIAGNOSIS — I10 Essential (primary) hypertension: Secondary | ICD-10-CM | POA: Insufficient documentation

## 2021-12-05 DIAGNOSIS — G8918 Other acute postprocedural pain: Secondary | ICD-10-CM | POA: Diagnosis not present

## 2021-12-05 HISTORY — PX: REVERSE SHOULDER ARTHROPLASTY: SHX5054

## 2021-12-05 LAB — ABO/RH: ABO/RH(D): A POS

## 2021-12-05 LAB — GLUCOSE, CAPILLARY: Glucose-Capillary: 98 mg/dL (ref 70–99)

## 2021-12-05 SURGERY — ARTHROPLASTY, SHOULDER, TOTAL, REVERSE
Anesthesia: General | Site: Shoulder | Laterality: Left

## 2021-12-05 MED ORDER — ROCURONIUM BROMIDE 10 MG/ML (PF) SYRINGE
PREFILLED_SYRINGE | INTRAVENOUS | Status: AC
  Filled 2021-12-05: qty 10

## 2021-12-05 MED ORDER — BUPIVACAINE-EPINEPHRINE (PF) 0.5% -1:200000 IJ SOLN
INTRAMUSCULAR | Status: DC | PRN
  Administered 2021-12-05 (×5): 3 mL via PERINEURAL

## 2021-12-05 MED ORDER — ONDANSETRON HCL 4 MG/2ML IJ SOLN
INTRAMUSCULAR | Status: AC
  Filled 2021-12-05: qty 2

## 2021-12-05 MED ORDER — PHENYLEPHRINE HCL (PRESSORS) 10 MG/ML IV SOLN
INTRAVENOUS | Status: AC
  Filled 2021-12-05: qty 1

## 2021-12-05 MED ORDER — LABETALOL HCL 5 MG/ML IV SOLN
INTRAVENOUS | Status: DC | PRN
  Administered 2021-12-05: 10 mg via INTRAVENOUS

## 2021-12-05 MED ORDER — METHOCARBAMOL 500 MG IVPB - SIMPLE MED: 500.0000 mg | Freq: Four times a day (QID) | INTRAVENOUS | Status: DC | PRN

## 2021-12-05 MED ORDER — ACETAMINOPHEN 160 MG/5ML PO SOLN: 325.0000 mg | ORAL | Status: DC | PRN

## 2021-12-05 MED ORDER — METHOCARBAMOL 500 MG PO TABS: 500.0000 mg | ORAL_TABLET | Freq: Four times a day (QID) | ORAL | Status: DC | PRN

## 2021-12-05 MED ORDER — METOCLOPRAMIDE HCL 5 MG/ML IJ SOLN: 5.0000 mg | Freq: Three times a day (TID) | INTRAMUSCULAR | Status: DC | PRN

## 2021-12-05 MED ORDER — PHENYLEPHRINE HCL-NACL 20-0.9 MG/250ML-% IV SOLN
INTRAVENOUS | Status: DC | PRN
  Administered 2021-12-05: 25 ug/min via INTRAVENOUS

## 2021-12-05 MED ORDER — TRANEXAMIC ACID-NACL 1000-0.7 MG/100ML-% IV SOLN
INTRAVENOUS | Status: AC
  Filled 2021-12-05: qty 100

## 2021-12-05 MED ORDER — ROCURONIUM BROMIDE 10 MG/ML (PF) SYRINGE
PREFILLED_SYRINGE | INTRAVENOUS | Status: DC | PRN
  Administered 2021-12-05: 70 mg via INTRAVENOUS

## 2021-12-05 MED ORDER — LACTATED RINGERS IV SOLN: INTRAVENOUS | Status: DC

## 2021-12-05 MED ORDER — ONDANSETRON HCL 4 MG/2ML IJ SOLN: 4.0000 mg | Freq: Four times a day (QID) | INTRAMUSCULAR | Status: DC | PRN

## 2021-12-05 MED ORDER — FENTANYL CITRATE (PF) 250 MCG/5ML IJ SOLN
INTRAMUSCULAR | Status: AC
  Filled 2021-12-05: qty 5

## 2021-12-05 MED ORDER — TRANEXAMIC ACID-NACL 1000-0.7 MG/100ML-% IV SOLN
1000.0000 mg | INTRAVENOUS | Status: AC
  Administered 2021-12-05: 1000 mg via INTRAVENOUS

## 2021-12-05 MED ORDER — MIDAZOLAM HCL 2 MG/2ML IJ SOLN
INTRAMUSCULAR | Status: AC
  Filled 2021-12-05: qty 2

## 2021-12-05 MED ORDER — ONDANSETRON HCL 4 MG/2ML IJ SOLN
INTRAMUSCULAR | Status: DC | PRN
  Administered 2021-12-05: 4 mg via INTRAVENOUS

## 2021-12-05 MED ORDER — PHENYLEPHRINE 40 MCG/ML (10ML) SYRINGE FOR IV PUSH (FOR BLOOD PRESSURE SUPPORT)
PREFILLED_SYRINGE | INTRAVENOUS | Status: AC
  Filled 2021-12-05: qty 10

## 2021-12-05 MED ORDER — OXYCODONE HCL 5 MG PO TABS: 5.0000 mg | ORAL_TABLET | Freq: Once | ORAL | Status: DC | PRN

## 2021-12-05 MED ORDER — ACETAMINOPHEN 325 MG PO TABS: 325.0000 mg | ORAL_TABLET | ORAL | Status: DC | PRN

## 2021-12-05 MED ORDER — FENTANYL CITRATE (PF) 100 MCG/2ML IJ SOLN
INTRAMUSCULAR | Status: DC | PRN
  Administered 2021-12-05 (×3): 50 ug via INTRAVENOUS

## 2021-12-05 MED ORDER — ONDANSETRON HCL 4 MG/2ML IJ SOLN: 4.0000 mg | Freq: Once | INTRAMUSCULAR | Status: DC | PRN

## 2021-12-05 MED ORDER — ACETAMINOPHEN 500 MG PO TABS: 1000.0000 mg | ORAL_TABLET | Freq: Four times a day (QID) | ORAL | Status: DC

## 2021-12-05 MED ORDER — CEFAZOLIN SODIUM-DEXTROSE 2-4 GM/100ML-% IV SOLN
INTRAVENOUS | Status: AC
  Filled 2021-12-05: qty 100

## 2021-12-05 MED ORDER — ACETAMINOPHEN 325 MG PO TABS: 325.0000 mg | ORAL_TABLET | Freq: Four times a day (QID) | ORAL | Status: DC | PRN

## 2021-12-05 MED ORDER — SUGAMMADEX SODIUM 200 MG/2ML IV SOLN
INTRAVENOUS | Status: DC | PRN
  Administered 2021-12-05: 430 mg via INTRAVENOUS

## 2021-12-05 MED ORDER — LIDOCAINE HCL (CARDIAC) PF 100 MG/5ML IV SOSY
PREFILLED_SYRINGE | INTRAVENOUS | Status: DC | PRN
  Administered 2021-12-05: 100 mg via INTRATRACHEAL

## 2021-12-05 MED ORDER — PROPOFOL 10 MG/ML IV BOLUS
INTRAVENOUS | Status: DC | PRN
  Administered 2021-12-05: 150 mg via INTRAVENOUS

## 2021-12-05 MED ORDER — SODIUM CHLORIDE 0.9 % IR SOLN
Status: DC | PRN
  Administered 2021-12-05: 1000 mL

## 2021-12-05 MED ORDER — CHLORHEXIDINE GLUCONATE 0.12 % MT SOLN: 15.0000 mL | Freq: Once | OROMUCOSAL | Status: DC

## 2021-12-05 MED ORDER — DEXAMETHASONE SODIUM PHOSPHATE 10 MG/ML IJ SOLN
INTRAMUSCULAR | Status: DC | PRN
  Administered 2021-12-05: 10 mg via INTRAVENOUS

## 2021-12-05 MED ORDER — DEXAMETHASONE SODIUM PHOSPHATE 10 MG/ML IJ SOLN
INTRAMUSCULAR | Status: AC
  Filled 2021-12-05: qty 1

## 2021-12-05 MED ORDER — ONDANSETRON HCL 4 MG PO TABS: 4.0000 mg | ORAL_TABLET | Freq: Four times a day (QID) | ORAL | Status: DC | PRN

## 2021-12-05 MED ORDER — 0.9 % SODIUM CHLORIDE (POUR BTL) OPTIME
TOPICAL | Status: DC | PRN
  Administered 2021-12-05: 1000 mL

## 2021-12-05 MED ORDER — OXYCODONE HCL 5 MG/5ML PO SOLN: 5.0000 mg | Freq: Once | ORAL | Status: DC | PRN

## 2021-12-05 MED ORDER — HYDROMORPHONE HCL 1 MG/ML IJ SOLN: 0.5000 mg | INTRAMUSCULAR | Status: DC | PRN

## 2021-12-05 MED ORDER — OXYCODONE-ACETAMINOPHEN 5-325 MG PO TABS: 1.0000 | ORAL_TABLET | Freq: Four times a day (QID) | ORAL | 0 refills | Status: AC | PRN

## 2021-12-05 MED ORDER — CEFAZOLIN SODIUM-DEXTROSE 2-4 GM/100ML-% IV SOLN
2.0000 g | INTRAVENOUS | Status: AC
  Administered 2021-12-05: 2 g via INTRAVENOUS

## 2021-12-05 MED ORDER — MIDAZOLAM HCL 5 MG/5ML IJ SOLN
INTRAMUSCULAR | Status: DC | PRN
  Administered 2021-12-05 (×2): 1 mg via INTRAVENOUS

## 2021-12-05 MED ORDER — ORAL CARE MOUTH RINSE: 15.0000 mL | Freq: Once | OROMUCOSAL | Status: DC

## 2021-12-05 MED ORDER — METOCLOPRAMIDE HCL 5 MG PO TABS: 5.0000 mg | ORAL_TABLET | Freq: Three times a day (TID) | ORAL | Status: DC | PRN

## 2021-12-05 MED ORDER — PHENYLEPHRINE 40 MCG/ML (10ML) SYRINGE FOR IV PUSH (FOR BLOOD PRESSURE SUPPORT)
PREFILLED_SYRINGE | INTRAVENOUS | Status: DC | PRN
  Administered 2021-12-05 (×3): 120 ug via INTRAVENOUS

## 2021-12-05 MED ORDER — FENTANYL CITRATE PF 50 MCG/ML IJ SOSY: 25.0000 ug | PREFILLED_SYRINGE | INTRAMUSCULAR | Status: DC | PRN

## 2021-12-05 MED ORDER — OXYCODONE HCL 5 MG PO TABS: 5.0000 mg | ORAL_TABLET | ORAL | Status: DC | PRN

## 2021-12-05 MED ORDER — OXYCODONE HCL 5 MG PO TABS: 10.0000 mg | ORAL_TABLET | ORAL | Status: DC | PRN

## 2021-12-05 MED ORDER — BUPIVACAINE LIPOSOME 1.3 % IJ SUSP
INTRAMUSCULAR | Status: DC | PRN
  Administered 2021-12-05 (×5): 2 mL via PERINEURAL

## 2021-12-05 MED ORDER — WATER FOR IRRIGATION, STERILE IR SOLN
Status: DC | PRN
  Administered 2021-12-05: 2000 mL

## 2021-12-05 MED ORDER — SUGAMMADEX SODIUM 500 MG/5ML IV SOLN
INTRAVENOUS | Status: AC
  Filled 2021-12-05: qty 5

## 2021-12-05 MED ORDER — LIDOCAINE HCL (PF) 2 % IJ SOLN
INTRAMUSCULAR | Status: AC
  Filled 2021-12-05: qty 5

## 2021-12-05 SURGICAL SUPPLY — 73 items
BAG COUNTER SPONGE SURGICOUNT (BAG) ×1 IMPLANT
BAG ZIPLOCK 12X15 (MISCELLANEOUS) ×3 IMPLANT
BASEPLATE P2 COATD GLND 6.5X30 (Shoulder) IMPLANT
BIT DRILL 1.6MX128 (BIT) IMPLANT
BIT DRILL 2.5 DIA 127 CALI (BIT) ×1 IMPLANT
BIT DRILL 4 DIA CALIBRATED (BIT) ×1 IMPLANT
BLADE SAW SAG 73X25 THK (BLADE) ×1
BLADE SAW SGTL 73X25 THK (BLADE) ×1 IMPLANT
BOOTIES KNEE HIGH SLOAN (MISCELLANEOUS) ×4 IMPLANT
COOLER ICEMAN CLASSIC (MISCELLANEOUS) ×1 IMPLANT
COVER BACK TABLE 60X90IN (DRAPES) ×2 IMPLANT
COVER SURGICAL LIGHT HANDLE (MISCELLANEOUS) ×2 IMPLANT
DRAPE INCISE IOBAN 66X45 STRL (DRAPES) ×2 IMPLANT
DRAPE ORTHO SPLIT 77X108 STRL (DRAPES) ×4
DRAPE POUCH INSTRU U-SHP 10X18 (DRAPES) ×2 IMPLANT
DRAPE SHEET LG 3/4 BI-LAMINATE (DRAPES) ×2 IMPLANT
DRAPE SURG 17X11 SM STRL (DRAPES) ×2 IMPLANT
DRAPE SURG ORHT 6 SPLT 77X108 (DRAPES) ×2 IMPLANT
DRAPE TOP 10253 STERILE (DRAPES) ×2 IMPLANT
DRAPE U-SHAPE 47X51 STRL (DRAPES) ×2 IMPLANT
DRSG AQUACEL AG ADV 3.5X 6 (GAUZE/BANDAGES/DRESSINGS) ×2 IMPLANT
DURAPREP 26ML APPLICATOR (WOUND CARE) ×4 IMPLANT
ELECT BLADE TIP CTD 4 INCH (ELECTRODE) ×2 IMPLANT
ELECT REM PT RETURN 15FT ADLT (MISCELLANEOUS) ×2 IMPLANT
GLOVE SRG 8 PF TXTR STRL LF DI (GLOVE) ×1 IMPLANT
GLOVE SURG ENC MOIS LTX SZ7.5 (GLOVE) ×2 IMPLANT
GLOVE SURG POLYISO LF SZ6.5 (GLOVE) ×2 IMPLANT
GLOVE SURG UNDER POLY LF SZ6.5 (GLOVE) ×2 IMPLANT
GLOVE SURG UNDER POLY LF SZ8 (GLOVE) ×2
GOWN STRL REUS W/TWL LRG LVL3 (GOWN DISPOSABLE) ×2 IMPLANT
GOWN STRL REUS W/TWL XL LVL3 (GOWN DISPOSABLE) ×2 IMPLANT
HANDPIECE INTERPULSE COAX TIP (DISPOSABLE) ×2
HOOD PEEL AWAY FLYTE STAYCOOL (MISCELLANEOUS) ×6 IMPLANT
INSERT SMALL SOCKET 32MM NEU (Insert) ×1 IMPLANT
KIT BASIN OR (CUSTOM PROCEDURE TRAY) ×2 IMPLANT
KIT TURNOVER KIT A (KITS) IMPLANT
MANIFOLD NEPTUNE II (INSTRUMENTS) ×2 IMPLANT
NDL TROCAR POINT SZ 2 1/2 (NEEDLE) IMPLANT
NEEDLE TROCAR POINT SZ 2 1/2 (NEEDLE) IMPLANT
NS IRRIG 1000ML POUR BTL (IV SOLUTION) ×2 IMPLANT
P2 COATDE GLNOID BSEPLT 6.5X30 (Shoulder) ×2 IMPLANT
PACK SHOULDER (CUSTOM PROCEDURE TRAY) ×2 IMPLANT
PAD COLD SHLDR WRAP-ON (PAD) ×1 IMPLANT
PROTECTOR NERVE ULNAR (MISCELLANEOUS) IMPLANT
RESTRAINT HEAD UNIVERSAL NS (MISCELLANEOUS) ×1 IMPLANT
RETRIEVER SUT HEWSON (MISCELLANEOUS) IMPLANT
SCREW BONE LOCKING RSP 5.0X14 (Screw) ×4 IMPLANT
SCREW BONE LOCKING RSP 5.0X30 (Screw) ×4 IMPLANT
SCREW BONE RSP LOCK 5X14 (Screw) IMPLANT
SCREW BONE RSP LOCK 5X30 (Screw) IMPLANT
SCREW RETAIN W/HEAD 4MM OFFSET (Shoulder) ×1 IMPLANT
SET HNDPC FAN SPRY TIP SCT (DISPOSABLE) ×1 IMPLANT
SLING ARM IMMOBILIZER LRG (SOFTGOODS) ×1 IMPLANT
SLING ARM IMMOBILIZER MED (SOFTGOODS) IMPLANT
SPONGE T-LAP 18X18 ~~LOC~~+RFID (SPONGE) ×3 IMPLANT
SPONGE T-LAP 4X18 ~~LOC~~+RFID (SPONGE) ×2 IMPLANT
STEM HUMERAL SM SHELL 12X48 (Stem) IMPLANT
STEM HUMERAL SMALL SHELL 12X48 (Stem) ×2 IMPLANT
STRIP CLOSURE SKIN 1/2X4 (GAUZE/BANDAGES/DRESSINGS) ×4 IMPLANT
SUCTION FRAZIER HANDLE 10FR (MISCELLANEOUS)
SUCTION TUBE FRAZIER 10FR DISP (MISCELLANEOUS) IMPLANT
SUPPORT WRAP ARM LG (MISCELLANEOUS) IMPLANT
SUT ETHIBOND 2 V 37 (SUTURE) ×1 IMPLANT
SUT FIBERWIRE #2 38 REV NDL BL (SUTURE) ×2
SUT MNCRL AB 4-0 PS2 18 (SUTURE) ×2 IMPLANT
SUT VIC AB 2-0 CT1 27 (SUTURE) ×2
SUT VIC AB 2-0 CT1 TAPERPNT 27 (SUTURE) ×1 IMPLANT
SUTURE FIBERWR#2 38 REV NDL BL (SUTURE) IMPLANT
TAPE LABRALWHITE 1.5X36 (TAPE) IMPLANT
TAPE SUT LABRALTAP WHT/BLK (SUTURE) IMPLANT
TOWEL OR 17X26 10 PK STRL BLUE (TOWEL DISPOSABLE) ×2 IMPLANT
TOWEL OR NON WOVEN STRL DISP B (DISPOSABLE) ×2 IMPLANT
WATER STERILE IRR 1000ML POUR (IV SOLUTION) ×3 IMPLANT

## 2021-12-05 NOTE — Anesthesia Procedure Notes (Signed)
Anesthesia Regional Block: Interscalene brachial plexus block   Pre-Anesthetic Checklist: , timeout performed,  Correct Patient, Correct Site, Correct Laterality,  Correct Procedure, Correct Position, site marked,  Risks and benefits discussed,  Surgical consent,  Pre-op evaluation,  At surgeon's request and post-op pain management  Laterality: Left and Upper  Prep: chloraprep       Needles:  Injection technique: Single-shot  Needle Type: Echogenic Stimulator Needle     Needle Length: 9cm  Needle Gauge: 20   Needle insertion depth: 1 cm   Additional Needles:   Procedures:,,,, ultrasound used (permanent image in chart),,    Narrative:  Start time: 12/05/2021 7:07 AM End time: 12/05/2021 7:15 AM Injection made incrementally with aspirations every 5 mL.  Performed by: Personally  Anesthesiologist: Leilani Able, MD

## 2021-12-05 NOTE — Anesthesia Preprocedure Evaluation (Addendum)
Anesthesia Evaluation  Patient identified by MRN, date of birth, ID band Patient awake    Reviewed: Allergy & Precautions, NPO status , Patient's Chart, lab work & pertinent test results  Airway Mallampati: II       Dental no notable dental hx.    Pulmonary former smoker,    Pulmonary exam normal        Cardiovascular hypertension, Pt. on medications Normal cardiovascular exam     Neuro/Psych negative psych ROS   GI/Hepatic negative GI ROS, Neg liver ROS,   Endo/Other  negative endocrine ROS  Renal/GU negative Renal ROS  negative genitourinary   Musculoskeletal negative musculoskeletal ROS (+)   Abdominal (+) + obese,   Peds  Hematology   Anesthesia Other Findings   Reproductive/Obstetrics                            Anesthesia Physical Anesthesia Plan  ASA: 2  Anesthesia Plan: General   Post-op Pain Management: Regional block   Induction: Intravenous  PONV Risk Score and Plan: 3 and Ondansetron and Dexamethasone  Airway Management Planned: Oral ETT  Additional Equipment: None  Intra-op Plan:   Post-operative Plan: Extubation in OR  Informed Consent: I have reviewed the patients History and Physical, chart, labs and discussed the procedure including the risks, benefits and alternatives for the proposed anesthesia with the patient or authorized representative who has indicated his/her understanding and acceptance.     Dental advisory given  Plan Discussed with: CRNA  Anesthesia Plan Comments:         Anesthesia Quick Evaluation

## 2021-12-05 NOTE — Anesthesia Procedure Notes (Signed)
Procedure Name: Intubation Date/Time: 12/05/2021 7:42 AM Performed by: Donna Bernard, CRNA Pre-anesthesia Checklist: Patient identified, Emergency Drugs available, Suction available, Patient being monitored and Timeout performed Patient Re-evaluated:Patient Re-evaluated prior to induction Oxygen Delivery Method: Circle system utilized Preoxygenation: Pre-oxygenation with 100% oxygen Induction Type: IV induction Ventilation: Mask ventilation without difficulty Laryngoscope Size: Miller and 2 Tube type: Oral Tube size: 7.0 mm Number of attempts: 1 Airway Equipment and Method: Stylet Placement Confirmation: positive ETCO2, ETT inserted through vocal cords under direct vision, CO2 detector and breath sounds checked- equal and bilateral Secured at: 20 cm Tube secured with: Tape Dental Injury: Teeth and Oropharynx as per pre-operative assessment

## 2021-12-05 NOTE — Transfer of Care (Signed)
Immediate Anesthesia Transfer of Care Note  Patient: Theresa Moses  Procedure(s) Performed: REVERSE SHOULDER ARTHROPLASTY (Left: Shoulder)  Patient Location: PACU  Anesthesia Type:General and GA combined with regional for post-op pain  Level of Consciousness: sedated, drowsy, patient cooperative and responds to stimulation  Airway & Oxygen Therapy: Patient connected to face mask oxygen  Post-op Assessment: Report given to RN, Post -op Vital signs reviewed and stable and Patient moving all extremities X 4  Post vital signs: stable  Last Vitals:  Vitals Value Taken Time  BP 171/70 12/05/21 0917  Temp    Pulse 83 12/05/21 0919  Resp 33 12/05/21 0919  SpO2 92 % 12/05/21 0919  Vitals shown include unvalidated device data.  Last Pain:  Vitals:   12/05/21 0658  TempSrc: Oral         Complications: No notable events documented.

## 2021-12-05 NOTE — Progress Notes (Signed)
Epic down during pre-op phase, down time forms used, see physical chart.

## 2021-12-05 NOTE — Op Note (Signed)
Procedure(s): REVERSE SHOULDER ARTHROPLASTY Procedure Note  Theresa Moses female 81 y.o. 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 female  With endstage left shoulder arthritis with i rotator cuff disease. Pain and dysfunction interfered with quality of life and nonoperative treatment with activity modification, NSAIDS and injections failed.     Surgeon: Rhae Hammock   Assistants: Sheryle Hail PA-C [name] was present and scrubbed throughout the procedure and was essential in positioning, retraction, exposure, and closure)  Anesthesia: General endotracheal anesthesia with preoperative interscalene block given by the attending anesthesiologist    Procedure Detail  REVERSE SHOULDER ARTHROPLASTY   Estimated Blood Loss:  200 mL         Drains: none  Blood Given: none          Specimens: none        Complications:  * No complications entered in OR log *         Disposition: PACU - hemodynamically stable.         Condition: stable      OPERATIVE FINDINGS:  A DJO Altivate pressfit reverse total shoulder arthroplasty was placed with a  size 12 stem, a 32-4 glenosphere, and a standard-mm poly insert. The base plate  fixation was excellent.  PROCEDURE: The patient was identified in the preoperative holding area  where I personally marked the operative site after verifying site, side,  and procedure with the patient. An interscalene block given by  the attending anesthesiologist in the holding area and the patient was taken back to the operating room where all extremities were  carefully padded in position after general anesthesia was induced. She  was placed in a beach-chair position and the operative upper extremity was  prepped and draped in a standard sterile fashion. An  approximately 10-  cm incision was made from the tip of the coracoid process to the center  point of the humerus at the level of the axilla. Dissection was carried  down through subcutaneous tissues to the level of the cephalic vein  which was taken laterally with the deltoid. The pectoralis major was  retracted medially. The subdeltoid space was developed and the lateral  edge of the conjoined tendon was identified. The undersurface of  conjoined tendon was palpated and the musculocutaneous nerve was not in  the field. Retractor was placed underneath the conjoined and second  retractor was placed lateral into the deltoid. The circumflex humeral  artery and vessels were identified and clamped and coagulated. The  biceps tendon was tenotomized.  The subscapularis was taken down as a peel with the underlying capsular.  The  joint was then gently externally rotated while the capsule was released  from the humeral neck around to just beyond the 6 o'clock position. At  this point, the joint was dislocated and the humeral head was presented  into the wound. The excessive osteophyte formation was removed with a  large rongeur.  The cutting guide was used to make the appropriate  head cut and the head was saved for potentially bone grafting.  The glenoid was exposed with the arm in an  abducted extended position. The anterior and posterior labrum were  completely excised and the capsule was released circumferentially to  allow for exposure of the glenoid for preparation. The 2.5 mm drill was  placed using the guide in 5-10 inferior angulation and the tap was then advanced in the same hole. Small and large reamers were then used. The tap was then removed and the Metaglene was then screwed in with [place].  The peripheral guide was then used to drilled measured and filled peripheral locking screws. The size 32-4 glenosphere was then impacted on the [facility] taper and the central screw was placed. The  humerus was then again exposed and the diaphyseal reamers were used followed by the metaphyseal reamers. The final broach was left in place in the proximal trial was placed. The joint was reduced and with this implant it was felt that soft tissue tensioning was appropriate with excellent stability and excellent range of motion. Therefore, final humeral stem was placed press-fit.  And then the trial polyethylene inserts were tested again and the above implant was felt to be the most appropriate for final insertion. The joint was reduced taken through full range of motion and felt to be stable. Soft tissue tension was appropriate.  The joint was then copiously irrigated with pulse  lavage and the wound was then closed. The subscapularis was repaired through bone tunnels.  Skin was closed with 2-0 Vicryl in a deep dermal layer and 4-0  Monocryl for skin closure. Steri-Strips were applied. Sterile  dressings were then applied as well as a sling. The patient was allowed  to awaken from general anesthesia, transferred to stretcher, and taken  to recovery room in stable condition.   POSTOPERATIVE PLAN: The patient will be observed in the recovery room and if her pain is well controlled and she is hemodynamically stable she could go home today with family.

## 2021-12-05 NOTE — H&P (Signed)
Theresa Moses is an 81 y.o. female.   Chief Complaint: L shoulder pain and dysfunction HPI: Endstage L shoulder arthritis with rotator cuff disease with significant pain and dysfunction, failed conservative measures.  Pain interferes with sleep and quality of life.   Past Medical History:  Diagnosis Date   Dyspnea    with exertion   HLD (hyperlipidemia)    Hypertension    Pre-diabetes    Sarcoidosis of lung (HCC) 1990    Past Surgical History:  Procedure Laterality Date   COLONOSCOPY  2021   KNEE ARTHROSCOPY Right 1995   TOE SURGERY Bilateral 04/2019    Family History  Problem Relation Age of Onset   Breast cancer Sister    Social History:  reports that she quit smoking about 26 years ago. Her smoking use included cigarettes. She has a 1.50 pack-year smoking history. She has never used smokeless tobacco. She reports current alcohol use. She reports that she does not use drugs.  Allergies:  Allergies  Allergen Reactions   Latex Hives   Neosporin [Neomycin-Polymyxin-Gramicidin] Hives    Medications Prior to Admission  Medication Sig Dispense Refill   aspirin EC 81 MG tablet Take 81 mg by mouth in the morning. Swallow whole.     atorvastatin (LIPITOR) 40 MG tablet Take 40 mg by mouth every evening.     Calcium Carb-Cholecalciferol (CALCIUM PLUS VITAMIN D3 PO) Take 1 tablet by mouth in the morning.     cetirizine (ZYRTEC) 10 MG tablet Take 10 mg by mouth at bedtime.     Coenzyme Q10-Vitamin E (QUNOL ULTRA COQ10 PO) Take 1 capsule by mouth every evening.     cyanocobalamin 500 MCG tablet Take 500 mcg by mouth daily.     DULoxetine (CYMBALTA) 30 MG capsule Take 1 capsule (30 mg total) by mouth at bedtime. 90 capsule 1   DULoxetine (CYMBALTA) 60 MG capsule Take 1 capsule (60 mg total) by mouth at bedtime. 90 capsule 1   fluticasone (FLONASE) 50 MCG/ACT nasal spray Place 2 sprays into both nostrils daily as needed for allergies or rhinitis.     Garlic (GARLIQUE PO) Take 1 capsule  by mouth daily.     glucosamine-chondroitin 500-400 MG tablet Take 1 tablet by mouth in the morning.     hydrochlorothiazide (MICROZIDE) 12.5 MG capsule Take 12.5 mg by mouth in the morning.     ibuprofen (ADVIL) 800 MG tablet Take 800 mg by mouth 3 (three) times daily as needed for pain.     methocarbamol (ROBAXIN) 500 MG tablet Take 500-1,000 mg by mouth every 6 (six) hours as needed for spasms.     montelukast (SINGULAIR) 10 MG tablet Take 10 mg by mouth at bedtime.     Multiple Vitamins-Minerals (CENTRUM SILVER PO) Take 1 tablet by mouth in the morning.     olmesartan (BENICAR) 20 MG tablet Take 20 mg by mouth in the morning.     Omega-3 Fatty Acids (FISH OIL PO) Take 1,200 mg by mouth in the morning.     Polyethyl Glycol-Propyl Glycol (SYSTANE) 0.4-0.3 % SOLN Place 1-2 drops into both eyes 3 (three) times daily as needed (dry/irritated eyes.).     polyethylene glycol (MIRALAX / GLYCOLAX) 17 g packet Take 17 g by mouth daily as needed (constipation.).     psyllium (METAMUCIL) 58.6 % packet Take 1 packet by mouth in the morning and at bedtime.      Results for orders placed or performed during the hospital encounter of  12/05/21 (from the past 48 hour(s))  ABO/Rh     Status: None (Preliminary result)   Collection Time: 12/05/21  6:36 AM  Result Value Ref Range   ABO/RH(D) PENDING   Glucose, capillary     Status: None   Collection Time: 12/05/21  6:41 AM  Result Value Ref Range   Glucose-Capillary 98 70 - 99 mg/dL    Comment: Glucose reference range applies only to samples taken after fasting for at least 8 hours.   No results found.  Review of Systems  All other systems reviewed and are negative.  Blood pressure (!) 176/76, pulse 72, temperature 97.9 F (36.6 C), temperature source Oral, resp. rate 15, SpO2 94 %. Physical Exam HENT:     Head: Atraumatic.  Eyes:     Extraocular Movements: Extraocular movements intact.  Cardiovascular:     Pulses: Normal pulses.  Pulmonary:      Effort: Pulmonary effort is normal.  Musculoskeletal:     Comments: L shoulder pain with limited ROM. NVID  Neurological:     Mental Status: She is alert.  Psychiatric:        Mood and Affect: Mood normal.     Assessment/Plan L shoulder endstage arthritis with rotator cuff disease Plan L reverse TSA Risks / benefits of surgery discussed Consent on chart  NPO for OR Preop antibiotics   Glennon Hamilton, MD 12/05/2021, 7:08 AM

## 2021-12-05 NOTE — Anesthesia Postprocedure Evaluation (Signed)
Anesthesia Post Note  Patient: Theresa Moses  Procedure(s) Performed: REVERSE SHOULDER ARTHROPLASTY (Left: Shoulder)     Patient location during evaluation: PACU Anesthesia Type: General Level of consciousness: awake and sedated Pain management: pain level controlled Vital Signs Assessment: post-procedure vital signs reviewed and stable Respiratory status: spontaneous breathing Cardiovascular status: stable Postop Assessment: no apparent nausea or vomiting Anesthetic complications: no   No notable events documented.  Last Vitals:  Vitals:   12/05/21 0930 12/05/21 0945  BP: 116/62 122/61  Pulse: 77 74  Resp: 19 19  Temp:  36.6 C  SpO2: 95% 95%    Last Pain:  Vitals:   12/05/21 0945  TempSrc:   PainSc: 0-No pain                 Caren Macadam

## 2021-12-05 NOTE — Evaluation (Addendum)
Occupational Therapy Evaluation Patient Details Name: Theresa Moses MRN: [id number] DOB: 1941-09-24 Today's Date: 12/05/2021   History of Present Illness Theresa Moses PETTITT is a 81 y.o. 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   Clinical Impression   Theresa Moses is a 81 year old woman s/p shoulder replacement without functional use of right dominant upper extremity secondary to effects of surgery and interscalene block and shoulder precautions. Therapist provided education and instruction to patient and caregiver in regards to exercises, precautions, positioning, donning upper extremity clothing and bathing while maintaining shoulder precautions, ice and edema management and donning/doffing sling. Patient and caregiver verbalized understanding and demonstrated as needed. Patient needed assistance to donn shirt, underwear, pants, socks and shoes and provided with instruction on compensatory strategies to perform ADLs. Patient to follow up with MD for further therapy needs.        Recommendations for follow up therapy are one component of a multi-disciplinary discharge planning process, led by the attending physician.  Recommendations may be updated based on patient status, additional functional criteria and insurance authorization.   Follow Up Recommendations  Follow physician's recommendations for discharge plan and follow up therapies    Assistance Recommended at Discharge Intermittent Supervision/Assistance  Patient can return home with the following A little help with walking and/or transfers;A little help with bathing/dressing/bathroom;Assistance with cooking/housework    Functional Status Assessment  Patient has had a recent decline in their functional status and demonstrates the ability to make significant improvements in function in a reasonable and predictable amount  of time.  Equipment Recommendations  None recommended by OT    Recommendations for Other Services       Precautions / Restrictions Precautions Precautions: Shoulder Type of Shoulder Precautions: no AROM, no PROM Shoulder Interventions: Shoulder sling/immobilizer;At all times Precaution Booklet Issued:  (handouts) Required Braces or Orthoses: Sling Restrictions Weight Bearing Restrictions: Yes LUE Weight Bearing: Non weight bearing         Balance Overall balance assessment: Mild deficits observed, not formally tested (uses a cane)                                            Vision Patient Visual Report: No change from baseline       Perception     Praxis      Pertinent Vitals/Pain Pain Assessment Pain Assessment: No/denies pain (secondary to block)     Hand Dominance     Extremity/Trunk Assessment Upper Extremity Assessment Upper Extremity Assessment: RUE deficits/detail RUE Deficits / Details: impaired AROM secondary to block and shoulder limitations due to precautions   Lower Extremity Assessment Lower Extremity Assessment: Overall WFL for tasks assessed   Cervical / Trunk Assessment Cervical / Trunk Assessment: Normal   Communication     Cognition Arousal/Alertness: Awake/alert Behavior During Therapy: WFL for tasks assessed/performed Overall Cognitive Status: Within Functional Limits for tasks assessed                                                  Home Living Family/patient expects to be discharged to:: Private residence Living Arrangements: Children  Bathroom Shower/Tub: Tub/shower unit                            OT Problem List: Decreased strength;Impaired UE functional use;Pain;Decreased range of motion;Obesity      OT Treatment/Interventions:      OT Goals(Current goals can be found in the care plan section) Acute Rehab OT Goals OT Goal Formulation: All assessment  and education complete, DC therapy  OT Frequency:      Co-evaluation              AM-PAC OT "6 Clicks" Daily Activity     Outcome Measure Help from another person eating meals?: A Little Help from another person taking care of personal grooming?: A Little Help from another person toileting, which includes using toliet, bedpan, or urinal?: A Little Help from another person bathing (including washing, rinsing, drying)?: A Little Help from another person to put on and taking off regular upper body clothing?: A Lot Help from another person to put on and taking off regular lower body clothing?: A Lot 6 Click Score: 16   End of Session Nurse Communication:  (OT education complete)  Activity Tolerance: Patient tolerated treatment well Patient left: in chair;with call bell/phone within reach;with family/visitor present  OT Visit Diagnosis: Muscle weakness (generalized) (M62.81)                Time: 1040-1107 OT Time Calculation (min): 27 min Charges:  OT General Charges $OT Visit: 1 Visit OT Evaluation $OT Eval Low Complexity: 1 Low OT Treatments $Self Care/Home Management : 8-22 mins  Aspyn Warnke, OTR/L Acute Care Rehab Services  Office [phone] Pager: [phone]   Kelli Churn 12/05/2021, 11:21 AM

## 2021-12-05 NOTE — Discharge Instructions (Addendum)

## 2021-12-06 ENCOUNTER — Encounter (HOSPITAL_COMMUNITY): Payer: Self-pay | Admitting: Orthopedic Surgery

## 2021-12-18 DIAGNOSIS — Z9889 Other specified postprocedural states: Secondary | ICD-10-CM | POA: Diagnosis not present

## 2021-12-18 DIAGNOSIS — M19012 Primary osteoarthritis, left shoulder: Secondary | ICD-10-CM | POA: Diagnosis not present

## 2021-12-23 DIAGNOSIS — E1142 Type 2 diabetes mellitus with diabetic polyneuropathy: Secondary | ICD-10-CM | POA: Diagnosis not present

## 2021-12-23 DIAGNOSIS — E785 Hyperlipidemia, unspecified: Secondary | ICD-10-CM | POA: Diagnosis not present

## 2021-12-23 DIAGNOSIS — I10 Essential (primary) hypertension: Secondary | ICD-10-CM | POA: Diagnosis not present

## 2021-12-23 DIAGNOSIS — E1169 Type 2 diabetes mellitus with other specified complication: Secondary | ICD-10-CM | POA: Diagnosis not present

## 2021-12-23 DIAGNOSIS — Z85038 Personal history of other malignant neoplasm of large intestine: Secondary | ICD-10-CM | POA: Diagnosis not present

## 2022-01-15 DIAGNOSIS — Z9889 Other specified postprocedural states: Secondary | ICD-10-CM | POA: Diagnosis not present

## 2022-01-15 DIAGNOSIS — M19112 Post-traumatic osteoarthritis, left shoulder: Secondary | ICD-10-CM | POA: Diagnosis not present

## 2022-01-17 DIAGNOSIS — Z96612 Presence of left artificial shoulder joint: Secondary | ICD-10-CM | POA: Diagnosis not present

## 2022-01-17 DIAGNOSIS — M25612 Stiffness of left shoulder, not elsewhere classified: Secondary | ICD-10-CM | POA: Diagnosis not present

## 2022-01-20 DIAGNOSIS — M25612 Stiffness of left shoulder, not elsewhere classified: Secondary | ICD-10-CM | POA: Diagnosis not present

## 2022-01-20 DIAGNOSIS — Z96612 Presence of left artificial shoulder joint: Secondary | ICD-10-CM | POA: Diagnosis not present

## 2022-01-23 DIAGNOSIS — Z96612 Presence of left artificial shoulder joint: Secondary | ICD-10-CM | POA: Diagnosis not present

## 2022-01-23 DIAGNOSIS — M25612 Stiffness of left shoulder, not elsewhere classified: Secondary | ICD-10-CM | POA: Diagnosis not present

## 2022-01-28 DIAGNOSIS — M25612 Stiffness of left shoulder, not elsewhere classified: Secondary | ICD-10-CM | POA: Diagnosis not present

## 2022-01-28 DIAGNOSIS — Z96612 Presence of left artificial shoulder joint: Secondary | ICD-10-CM | POA: Diagnosis not present

## 2022-01-30 DIAGNOSIS — Z96612 Presence of left artificial shoulder joint: Secondary | ICD-10-CM | POA: Diagnosis not present

## 2022-01-30 DIAGNOSIS — M25612 Stiffness of left shoulder, not elsewhere classified: Secondary | ICD-10-CM | POA: Diagnosis not present

## 2022-02-04 DIAGNOSIS — M25612 Stiffness of left shoulder, not elsewhere classified: Secondary | ICD-10-CM | POA: Diagnosis not present

## 2022-02-04 DIAGNOSIS — Z96612 Presence of left artificial shoulder joint: Secondary | ICD-10-CM | POA: Diagnosis not present

## 2022-02-06 DIAGNOSIS — M25612 Stiffness of left shoulder, not elsewhere classified: Secondary | ICD-10-CM | POA: Diagnosis not present

## 2022-02-06 DIAGNOSIS — Z96612 Presence of left artificial shoulder joint: Secondary | ICD-10-CM | POA: Diagnosis not present

## 2022-02-11 DIAGNOSIS — Z96612 Presence of left artificial shoulder joint: Secondary | ICD-10-CM | POA: Diagnosis not present

## 2022-02-11 DIAGNOSIS — M25612 Stiffness of left shoulder, not elsewhere classified: Secondary | ICD-10-CM | POA: Diagnosis not present

## 2022-02-14 DIAGNOSIS — Z1231 Encounter for screening mammogram for malignant neoplasm of breast: Secondary | ICD-10-CM | POA: Diagnosis not present

## 2022-02-18 DIAGNOSIS — M25612 Stiffness of left shoulder, not elsewhere classified: Secondary | ICD-10-CM | POA: Diagnosis not present

## 2022-02-18 DIAGNOSIS — Z96612 Presence of left artificial shoulder joint: Secondary | ICD-10-CM | POA: Diagnosis not present

## 2022-02-20 DIAGNOSIS — M25612 Stiffness of left shoulder, not elsewhere classified: Secondary | ICD-10-CM | POA: Diagnosis not present

## 2022-02-20 DIAGNOSIS — Z96612 Presence of left artificial shoulder joint: Secondary | ICD-10-CM | POA: Diagnosis not present

## 2022-06-07 ENCOUNTER — Other Ambulatory Visit: Payer: Self-pay | Admitting: Neurology

## 2022-06-23 ENCOUNTER — Other Ambulatory Visit: Payer: Self-pay | Admitting: Internal Medicine

## 2022-06-23 ENCOUNTER — Ambulatory Visit
Admission: RE | Admit: 2022-06-23 | Discharge: 2022-06-23 | Disposition: A | Discharge status: Home / Self Care | Payer: Medicare Other | Source: Ambulatory Visit | Attending: Internal Medicine | Admitting: Internal Medicine

## 2022-06-23 DIAGNOSIS — E1142 Type 2 diabetes mellitus with diabetic polyneuropathy: Secondary | ICD-10-CM | POA: Diagnosis not present

## 2022-06-23 DIAGNOSIS — E1169 Type 2 diabetes mellitus with other specified complication: Secondary | ICD-10-CM | POA: Diagnosis not present

## 2022-06-23 DIAGNOSIS — J309 Allergic rhinitis, unspecified: Secondary | ICD-10-CM | POA: Diagnosis not present

## 2022-06-23 DIAGNOSIS — Q740 Other congenital malformations of upper limb(s), including shoulder girdle: Secondary | ICD-10-CM | POA: Diagnosis not present

## 2022-06-23 DIAGNOSIS — R221 Localized swelling, mass and lump, neck: Secondary | ICD-10-CM | POA: Diagnosis not present

## 2022-06-23 DIAGNOSIS — I10 Essential (primary) hypertension: Secondary | ICD-10-CM | POA: Diagnosis not present

## 2022-06-23 DIAGNOSIS — M19011 Primary osteoarthritis, right shoulder: Secondary | ICD-10-CM | POA: Diagnosis not present

## 2022-06-23 DIAGNOSIS — E785 Hyperlipidemia, unspecified: Secondary | ICD-10-CM | POA: Diagnosis not present

## 2022-06-24 ENCOUNTER — Other Ambulatory Visit: Payer: Self-pay | Admitting: Internal Medicine

## 2022-06-24 DIAGNOSIS — R221 Localized swelling, mass and lump, neck: Secondary | ICD-10-CM

## 2022-06-26 ENCOUNTER — Ambulatory Visit
Admission: RE | Admit: 2022-06-26 | Discharge: 2022-06-26 | Disposition: A | Discharge status: Home / Self Care | Payer: Medicare Other | Source: Ambulatory Visit | Attending: Internal Medicine | Admitting: Internal Medicine

## 2022-06-26 DIAGNOSIS — R1310 Dysphagia, unspecified: Secondary | ICD-10-CM | POA: Diagnosis not present

## 2022-06-26 DIAGNOSIS — R221 Localized swelling, mass and lump, neck: Secondary | ICD-10-CM | POA: Diagnosis not present

## 2022-07-18 DIAGNOSIS — Z23 Encounter for immunization: Secondary | ICD-10-CM | POA: Diagnosis not present

## 2022-08-05 DIAGNOSIS — E119 Type 2 diabetes mellitus without complications: Secondary | ICD-10-CM | POA: Diagnosis not present

## 2022-08-05 DIAGNOSIS — H52203 Unspecified astigmatism, bilateral: Secondary | ICD-10-CM | POA: Diagnosis not present

## 2022-08-10 DIAGNOSIS — Z20822 Contact with and (suspected) exposure to covid-19: Secondary | ICD-10-CM | POA: Diagnosis not present

## 2022-08-10 DIAGNOSIS — B9689 Other specified bacterial agents as the cause of diseases classified elsewhere: Secondary | ICD-10-CM | POA: Diagnosis not present

## 2022-08-10 DIAGNOSIS — J029 Acute pharyngitis, unspecified: Secondary | ICD-10-CM | POA: Diagnosis not present

## 2022-08-10 DIAGNOSIS — R059 Cough, unspecified: Secondary | ICD-10-CM | POA: Diagnosis not present

## 2022-08-10 DIAGNOSIS — J019 Acute sinusitis, unspecified: Secondary | ICD-10-CM | POA: Diagnosis not present

## 2022-08-13 ENCOUNTER — Ambulatory Visit: Payer: Medicare Other | Admitting: Neurology

## 2022-08-20 ENCOUNTER — Other Ambulatory Visit: Payer: Self-pay | Admitting: Family Medicine

## 2022-08-20 ENCOUNTER — Ambulatory Visit
Admission: RE | Admit: 2022-08-20 | Discharge: 2022-08-20 | Disposition: A | Discharge status: Home / Self Care | Payer: Medicare Other | Source: Ambulatory Visit | Attending: Family Medicine | Admitting: Family Medicine

## 2022-08-20 DIAGNOSIS — J069 Acute upper respiratory infection, unspecified: Secondary | ICD-10-CM | POA: Diagnosis not present

## 2022-08-20 DIAGNOSIS — R059 Cough, unspecified: Secondary | ICD-10-CM

## 2022-10-01 ENCOUNTER — Other Ambulatory Visit: Payer: Self-pay | Admitting: Neurology

## 2022-10-07 DIAGNOSIS — E785 Hyperlipidemia, unspecified: Secondary | ICD-10-CM | POA: Diagnosis not present

## 2022-10-07 DIAGNOSIS — E1169 Type 2 diabetes mellitus with other specified complication: Secondary | ICD-10-CM | POA: Diagnosis not present

## 2022-11-18 ENCOUNTER — Encounter: Payer: Self-pay | Admitting: Neurology

## 2022-11-18 ENCOUNTER — Ambulatory Visit: Payer: Medicare Other | Admitting: Neurology

## 2022-11-18 VITALS — BP 120/75 | HR 91 | Ht 67.0 in | Wt 225.0 lb

## 2022-11-18 DIAGNOSIS — J029 Acute pharyngitis, unspecified: Secondary | ICD-10-CM | POA: Diagnosis not present

## 2022-11-18 DIAGNOSIS — E1142 Type 2 diabetes mellitus with diabetic polyneuropathy: Secondary | ICD-10-CM | POA: Diagnosis not present

## 2022-11-18 DIAGNOSIS — L98499 Non-pressure chronic ulcer of skin of other sites with unspecified severity: Secondary | ICD-10-CM | POA: Diagnosis not present

## 2022-11-18 MED ORDER — DULOXETINE HCL 30 MG PO CPEP: 30.0000 mg | ORAL_CAPSULE | Freq: Every day | ORAL | 3 refills | Status: DC

## 2022-11-18 MED ORDER — DULOXETINE HCL 60 MG PO CPEP: 60.0000 mg | ORAL_CAPSULE | Freq: Every day | ORAL | 3 refills | Status: DC

## 2022-11-18 NOTE — Patient Instructions (Signed)
It was great to see you today!  Continue Cymbalta 90mg  daily  Return to clinic in 1 year

## 2022-11-18 NOTE — Progress Notes (Signed)
Follow-up Visit   Date: 11/18/22   [name] MRN: [id number] DOB: 11/14/1940   Interim History: Theresa Moses is a 82 y.o. right-handed female with very well controlled type 2 diabetes mellitus, pulmonary sarcoidosis, hyperlipidemia, hypertension, osteoarthritis, and chronic low back pain returning to the clinic for follow-up of neuropathy.  The patient was accompanied to the clinic by self.  IMPRESSION/PLAN: Diabetic neuropathy (HbA1c ~7) manifesting with distal paresthesias.  Pain is much better controlled on Cymbalta 90mg  daily which will be continued.  Refills provided.  She previously tried gabapentin (no benefit).  Return to clinic in 1 year  --------------------------------------------------------------------- History of present illness: Starting around early 2020, she began noticing numbness and tingling over the toes and top of the feet.  Symptoms are constant, without any exacerbating.  She has noticed that socks at bedtime helps.  She tried gabapentin but it did not provide any relief.  She denies imbalance, falls, or weakness.  No numbness/tingling in the hands.   UPDATE 11/18/2022:  She is here for 1 year follow-up visit.  Since increasing Cymbalta 90mg , her pain is much better controlled. Rare spells of sharp pain and significantly less tingling.  Numbness is unchanged. Symptoms remain in the feet without progression into the lower legs.  She denies weakness or falls.  She uses a cane as needed for right knee pain.   Medications:  Current Outpatient Medications on File Prior to Visit  Medication Sig Dispense Refill   aspirin EC 81 MG tablet Take 81 mg by mouth in the morning. Swallow whole.     atorvastatin (LIPITOR) 40 MG tablet Take 40 mg by mouth every evening.     Calcium Carb-Cholecalciferol (CALCIUM PLUS VITAMIN D3 PO) Take 1 tablet by mouth in the morning.     cetirizine (ZYRTEC) 10 MG tablet Take 10 mg by mouth at bedtime.     Coenzyme Q10-Vitamin E (QUNOL  ULTRA COQ10 PO) Take 1 capsule by mouth every evening.     cyanocobalamin 500 MCG tablet Take 500 mcg by mouth daily.     DULoxetine (CYMBALTA) 30 MG capsule TAKE 1 CAPSULE BY MOUTH AT  BEDTIME 90 capsule 0   DULoxetine (CYMBALTA) 60 MG capsule TAKE 1 CAPSULE BY MOUTH AT  BEDTIME 90 capsule 0   fluticasone (FLONASE) 50 MCG/ACT nasal spray Place 2 sprays into both nostrils daily as needed for allergies or rhinitis.     Garlic (GARLIQUE PO) Take 1 capsule by mouth daily.     glucosamine-chondroitin 500-400 MG tablet Take 1 tablet by mouth in the morning.     hydrochlorothiazide (MICROZIDE) 12.5 MG capsule Take 12.5 mg by mouth in the morning.     methocarbamol (ROBAXIN) 500 MG tablet Take 500-1,000 mg by mouth every 6 (six) hours as needed for spasms.     montelukast (SINGULAIR) 10 MG tablet Take 10 mg by mouth at bedtime.     Multiple Vitamins-Minerals (CENTRUM SILVER PO) Take 1 tablet by mouth in the morning.     olmesartan (BENICAR) 20 MG tablet Take 20 mg by mouth in the morning.     Omega-3 Fatty Acids (FISH OIL PO) Take 1,200 mg by mouth in the morning.     Polyethyl Glycol-Propyl Glycol (SYSTANE) 0.4-0.3 % SOLN Place 1-2 drops into both eyes 3 (three) times daily as needed (dry/irritated eyes.).     polyethylene glycol (MIRALAX / GLYCOLAX) 17 g packet Take 17 g by mouth daily as needed (constipation.).     psyllium (  METAMUCIL) 58.6 % packet Take 1 packet by mouth in the morning and at bedtime.     oxyCODONE-acetaminophen (PERCOCET) 5-325 MG tablet Take 1 tablet by mouth every 6 (six) hours as needed for severe pain. (Patient not taking: Reported on 11/18/2022) 20 tablet 0   No current facility-administered medications on file prior to visit.    Allergies:  Allergies  Allergen Reactions   Latex Hives   Neosporin [Neomycin-Polymyxin-Gramicidin] Hives    Vital Signs:  BP 120/75   Pulse 91   Ht 5\' 7"  (1.702 m)   Wt 225 lb (102.1 kg)   SpO2 96%   BMI 35.24 kg/m   Neurological  Exam: MENTAL STATUS including orientation to time, place, person, recent and remote memory, attention span and concentration, language, and fund of knowledge is normal.  Speech is not dysarthric.  CRANIAL NERVES:   Face is symmetric.    MOTOR:  Motor strength is 5/5 in all extremities, including distally.  No atrophy, fasciculations or abnormal movements.  No pronator drift.  Tone is normal.    MSRs:  Reflexes are 2+/4 throughout, except absent at the ankles bilaterally.  SENSORY: Vibration is reduced at the great toe bilaterally, intact at the ankles.    COORDINATION/GAIT: Gait is mildly wide-based, unassisted  Data: n/a    Thank you for allowing me to participate in patient's care.  If I can answer any additional questions, I would be pleased to do so.    Sincerely,    Wannetta Langland K. Posey Pronto, DO

## 2022-11-27 DIAGNOSIS — S31829D Unspecified open wound of left buttock, subsequent encounter: Secondary | ICD-10-CM | POA: Diagnosis not present

## 2022-12-23 DIAGNOSIS — M5030 Other cervical disc degeneration, unspecified cervical region: Secondary | ICD-10-CM | POA: Diagnosis not present

## 2022-12-23 DIAGNOSIS — H6122 Impacted cerumen, left ear: Secondary | ICD-10-CM | POA: Diagnosis not present

## 2022-12-23 DIAGNOSIS — M5136 Other intervertebral disc degeneration, lumbar region: Secondary | ICD-10-CM | POA: Diagnosis not present

## 2022-12-23 DIAGNOSIS — I10 Essential (primary) hypertension: Secondary | ICD-10-CM | POA: Diagnosis not present

## 2022-12-23 DIAGNOSIS — E1169 Type 2 diabetes mellitus with other specified complication: Secondary | ICD-10-CM | POA: Diagnosis not present

## 2022-12-23 DIAGNOSIS — Z0000 Encounter for general adult medical examination without abnormal findings: Secondary | ICD-10-CM | POA: Diagnosis not present

## 2022-12-23 DIAGNOSIS — M5134 Other intervertebral disc degeneration, thoracic region: Secondary | ICD-10-CM | POA: Diagnosis not present

## 2022-12-23 DIAGNOSIS — E1165 Type 2 diabetes mellitus with hyperglycemia: Secondary | ICD-10-CM | POA: Diagnosis not present

## 2022-12-23 DIAGNOSIS — E1142 Type 2 diabetes mellitus with diabetic polyneuropathy: Secondary | ICD-10-CM | POA: Diagnosis not present

## 2022-12-23 DIAGNOSIS — E785 Hyperlipidemia, unspecified: Secondary | ICD-10-CM | POA: Diagnosis not present

## 2022-12-25 ENCOUNTER — Encounter (HOSPITAL_BASED_OUTPATIENT_CLINIC_OR_DEPARTMENT_OTHER): Payer: Medicare Other | Admitting: General Surgery

## 2022-12-26 ENCOUNTER — Ambulatory Visit
Admission: RE | Admit: 2022-12-26 | Discharge: 2022-12-26 | Disposition: A | Discharge status: Home / Self Care | Payer: Medicare Other | Source: Ambulatory Visit | Attending: Internal Medicine | Admitting: Internal Medicine

## 2022-12-26 ENCOUNTER — Other Ambulatory Visit: Payer: Self-pay | Admitting: Internal Medicine

## 2022-12-26 DIAGNOSIS — M25561 Pain in right knee: Secondary | ICD-10-CM

## 2023-01-02 ENCOUNTER — Telehealth: Payer: Self-pay | Admitting: Anesthesiology

## 2023-01-02 MED ORDER — DULOXETINE HCL 30 MG PO CPEP: 30.0000 mg | ORAL_CAPSULE | Freq: Every day | ORAL | 1 refills | Status: DC

## 2023-01-02 MED ORDER — DULOXETINE HCL 60 MG PO CPEP: 60.0000 mg | ORAL_CAPSULE | Freq: Every day | ORAL | 1 refills | Status: DC

## 2023-01-02 NOTE — Telephone Encounter (Signed)
Pt called stating she needs a refill on her medications.   1. Which medications need refilled? Cymbalta 50 mg and 30 mg   2. Which pharmacy/location is medication to be sent to? [facility]    3. Do they need a 30 day or 90 day supply? 90 day supply

## 2023-01-02 NOTE — Telephone Encounter (Signed)
Pt called she said [facility] told her they didn't have any refills left. Pt advise I would send in RX for her,

## 2023-01-31 IMAGING — DX DG CHEST 2V
2 series · 2 of 2 positions shown · non-contrast
Comparison: 10/13/2018

CLINICAL DATA: 80-year-old female with a history of preop chest
x-ray

EXAM:
CHEST - 2 VIEW

[chest pa]
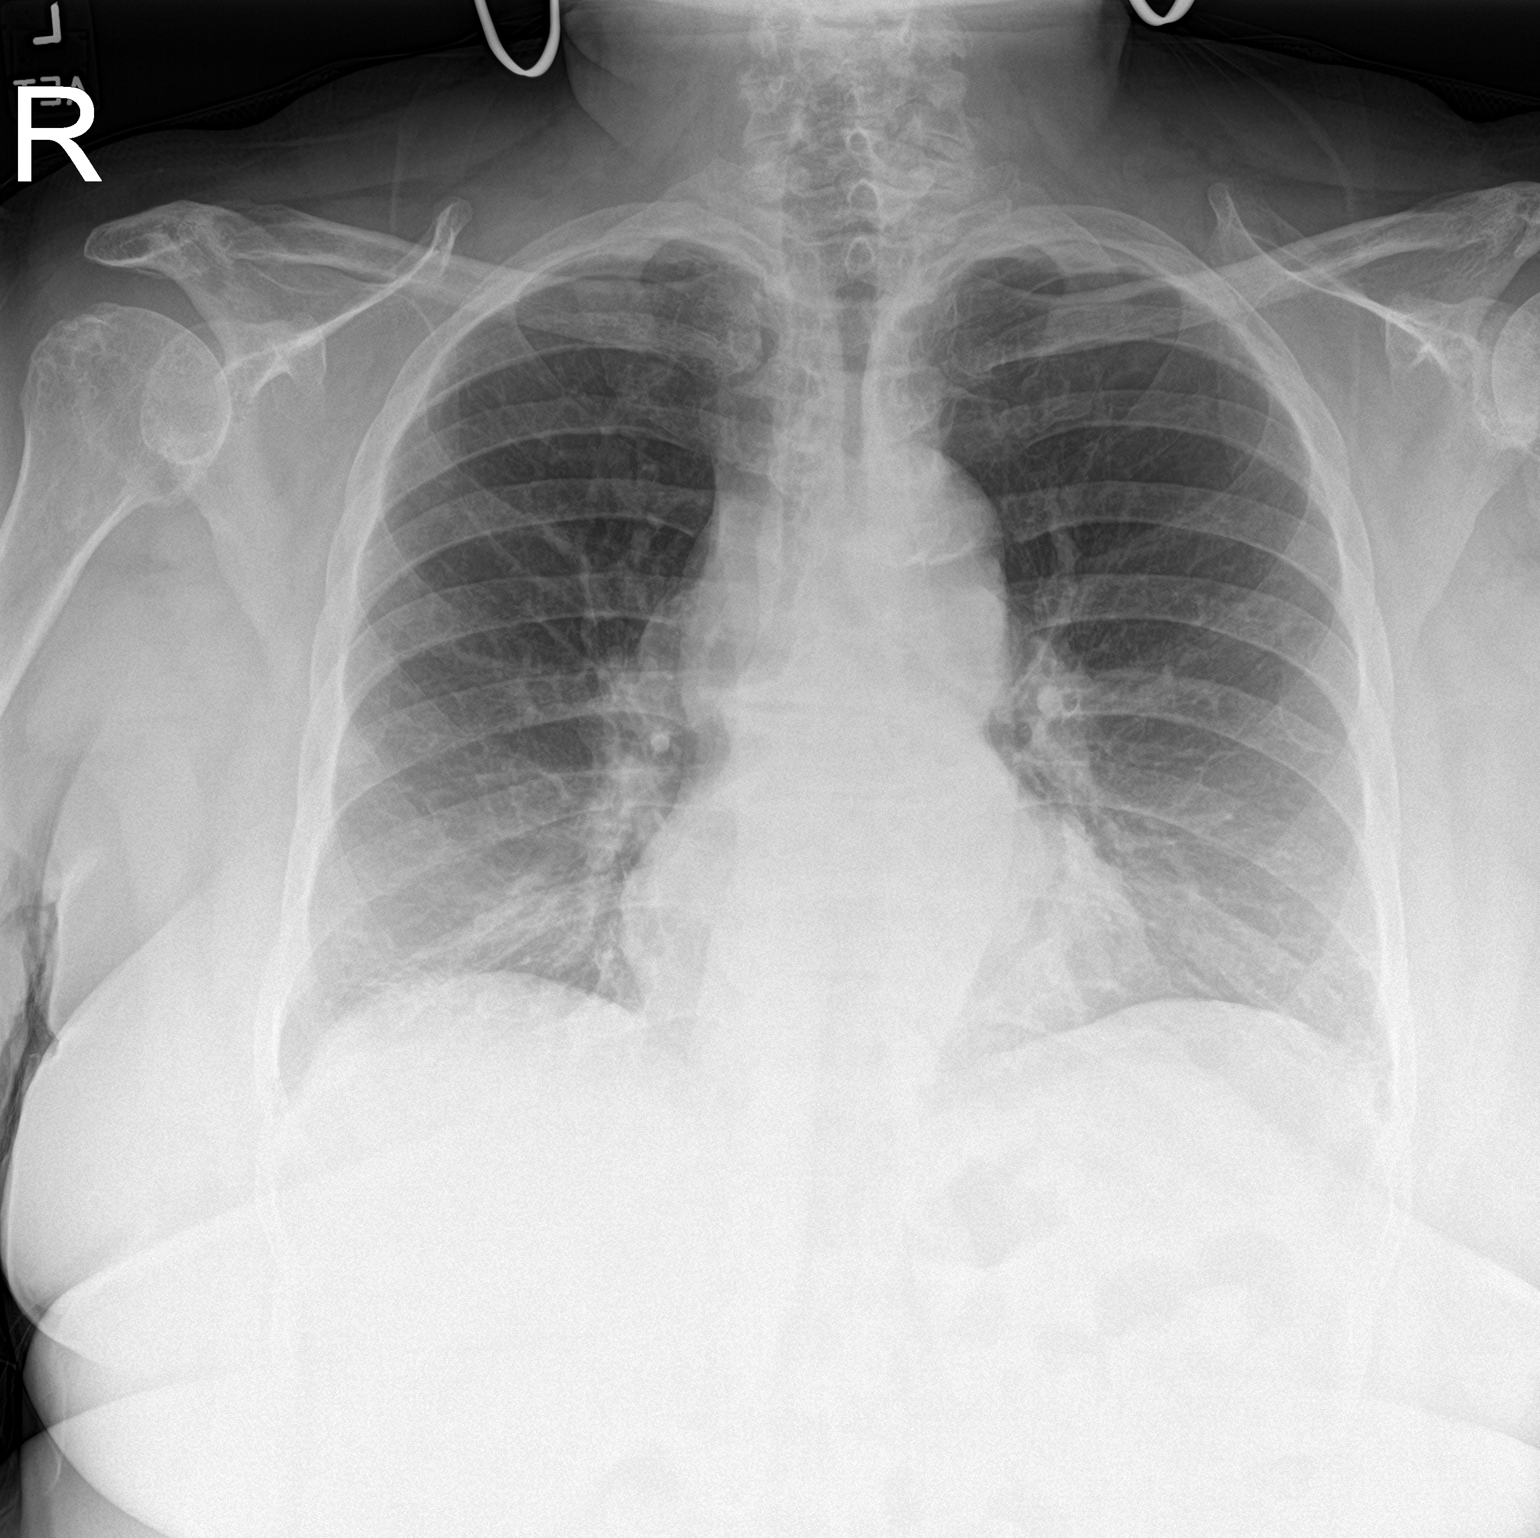

[chest lat]
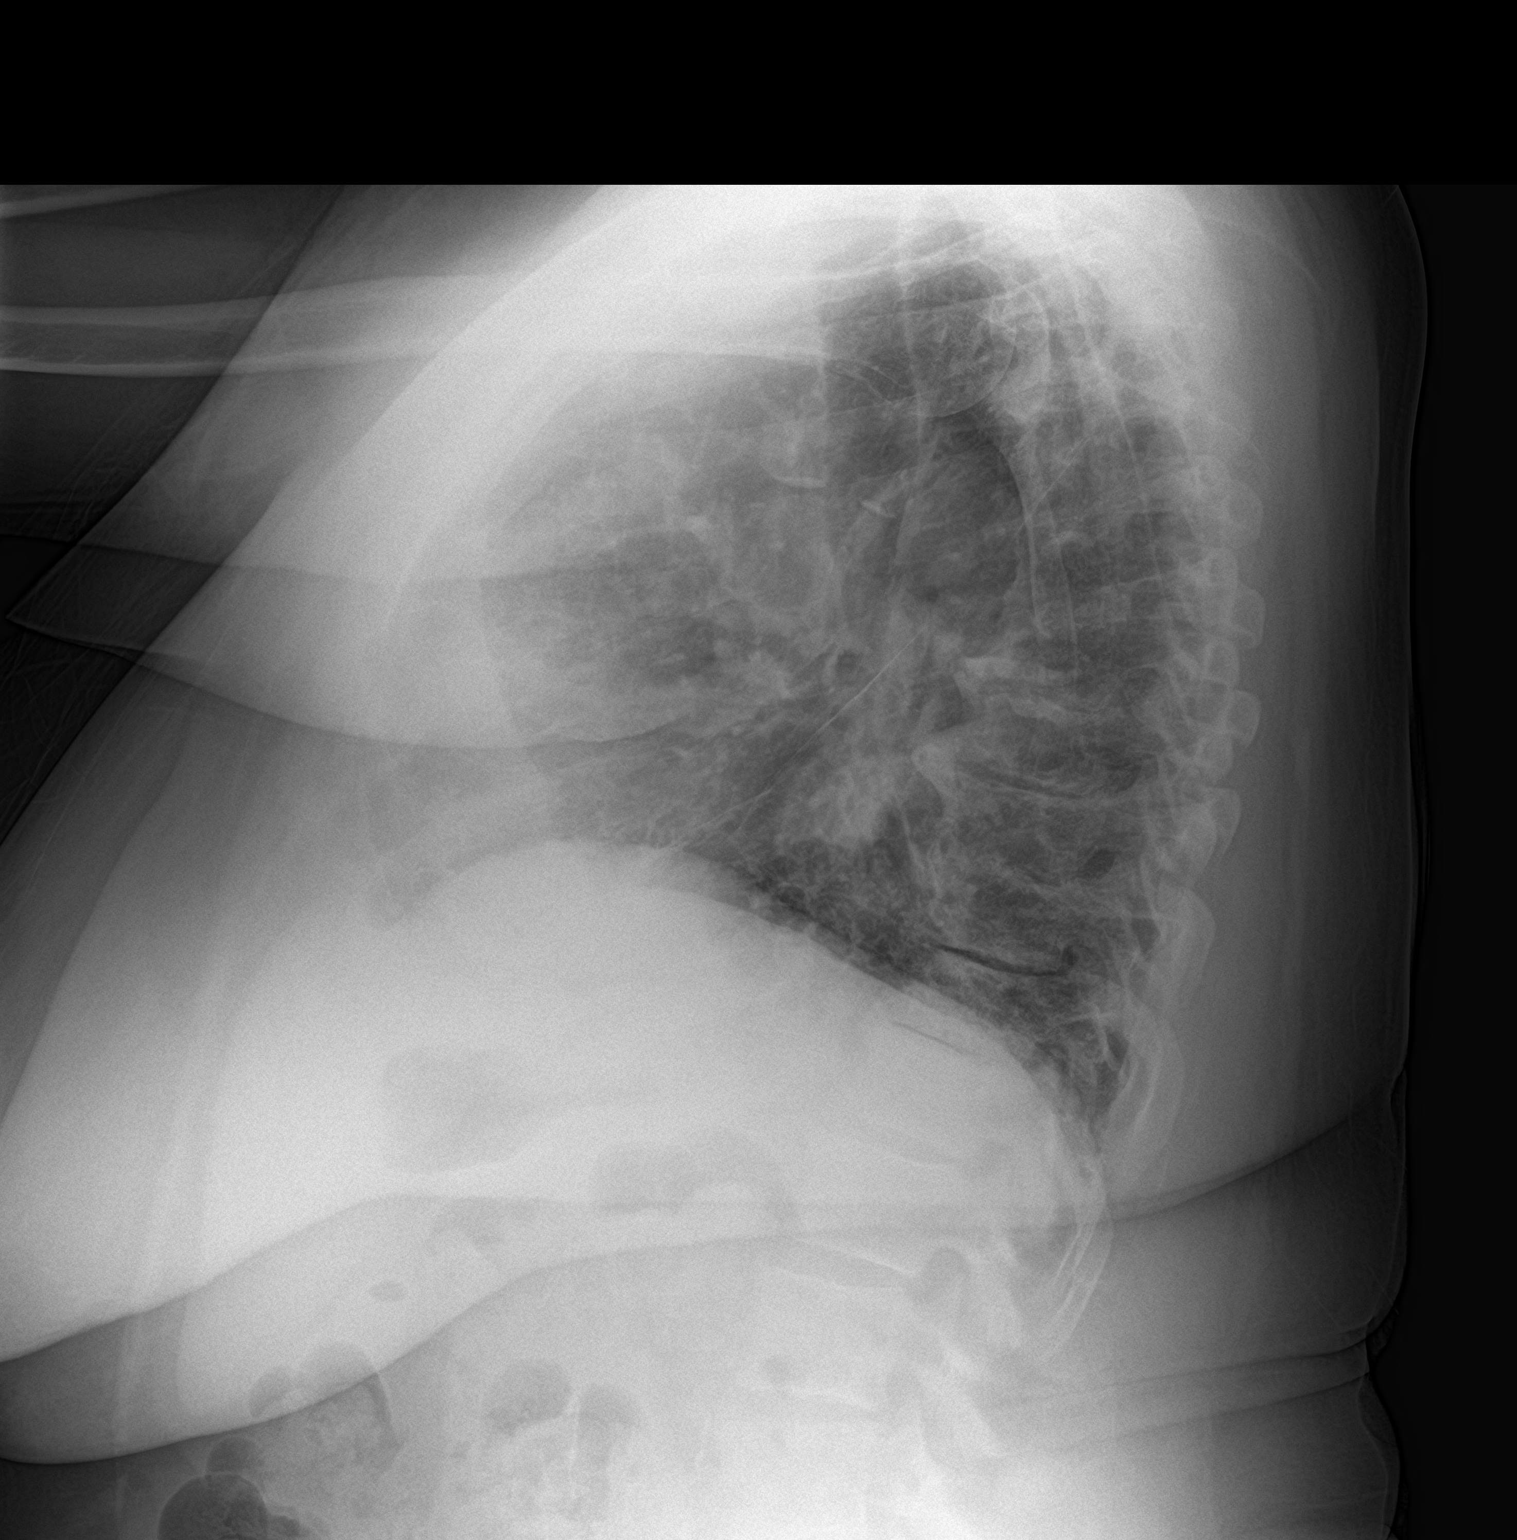

[2 of 2 positions shown; findings below may reference images not displayed]

FINDINGS: Cardiomediastinal silhouette unchanged in size and contour. No
evidence of central vascular congestion. No interlobular septal
thickening. Low lung volumes. Double density over the lower
mediastinum, compatible with hiatal hernia.

Streaky opacities at the right lung base, new from the comparison.

Degenerative changes spine.  No acute displaced fracture.
IMPRESSION: Low lung volumes with streaky opacity at the right lung base,
increased from the prior. Differential includes both atelectasis as
well as new focal inflammation/infection. Correlation with a
symptoms as well as potentially a short-term interval follow-up
chest x-ray may be useful.

## 2023-02-14 IMAGING — DX DG SHOULDER 1V*L*
1 series · 1 of 1 positions shown · non-contrast
Comparison: None.

CLINICAL DATA: Status post left reverse total shoulder
arthroplasty.

EXAM:
LEFT SHOULDER

[shoulder ap]
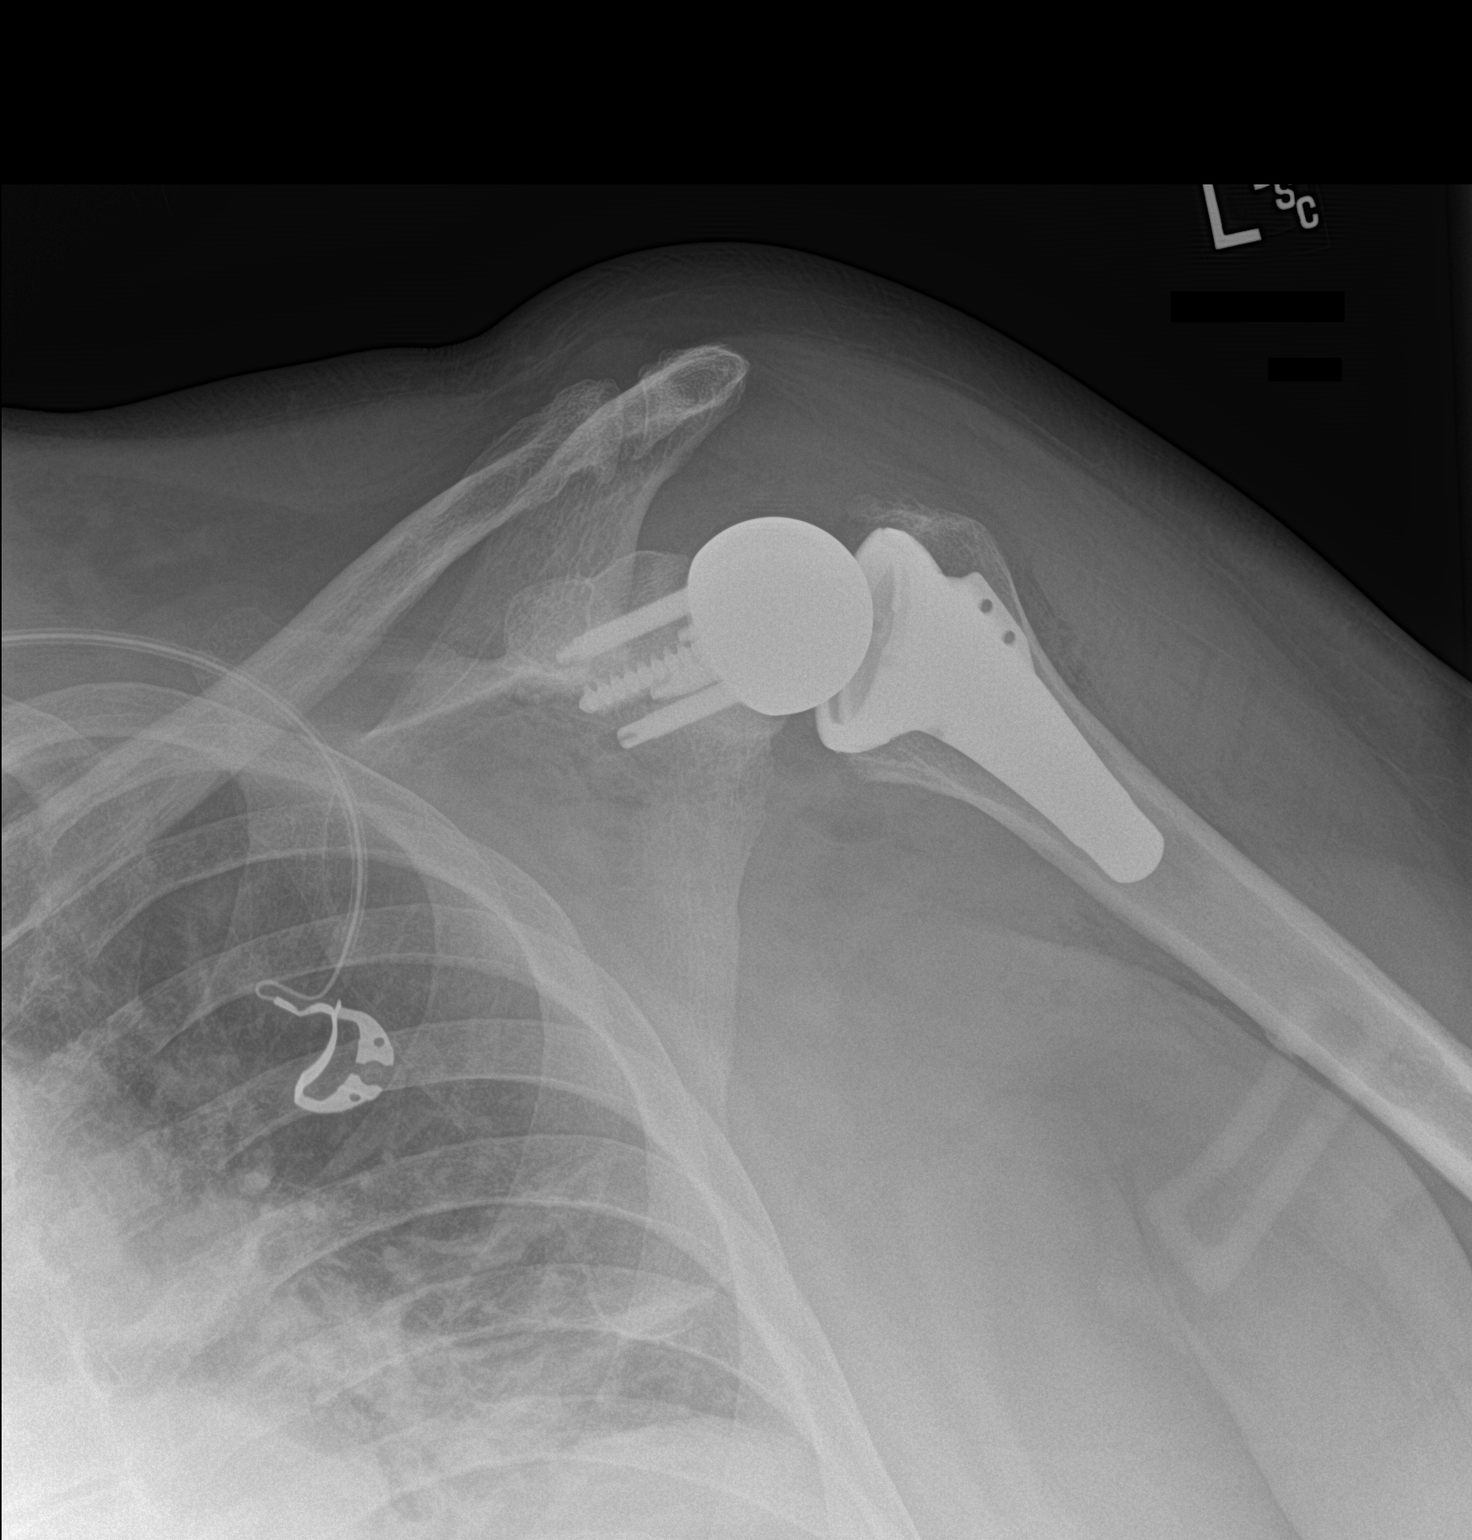

[1 of 1 positions shown; findings below may reference images not displayed]

FINDINGS: The left glenoid and humeral components are well situated. No
fracture or dislocation is noted.
IMPRESSION: Status post left reverse total shoulder arthroplasty.

## 2023-02-27 DIAGNOSIS — Z13820 Encounter for screening for osteoporosis: Secondary | ICD-10-CM | POA: Diagnosis not present

## 2023-02-27 DIAGNOSIS — Z1231 Encounter for screening mammogram for malignant neoplasm of breast: Secondary | ICD-10-CM | POA: Diagnosis not present

## 2023-05-16 LAB — HEMOGLOBIN A1C: Hemoglobin A1C: 6.5

## 2023-05-16 LAB — AMB RESULTS CONSOLE CBG: Glucose: 199

## 2023-05-16 NOTE — Progress Notes (Signed)
Pt has PCP (Dr. Jacqulyn Bath). No SDOH needs at this time. Pt will follow up with PCP about blood sugar.

## 2023-05-20 ENCOUNTER — Other Ambulatory Visit: Payer: Self-pay | Admitting: Neurology

## 2023-06-02 ENCOUNTER — Encounter: Payer: Self-pay | Admitting: *Deleted

## 2023-06-02 NOTE — Progress Notes (Signed)
Pt attended 05/16/2023 screening event where her b/p was 113/58, and her blood sugar was 199, and her A1C was 6.5. At the event, the pt identified Dr. Ardean Larsen as her PCP at [facility] at [facility] and did not identify any SDOH insecurities per her screening form and the event RN. The event RN also documented that pt was reminded to f/u her blood sugar screening results with her PCP. Chart review indicates that pt was last seen by Dr. Jacqulyn Bath for an adult medical exam on 12/23/22, and was seen on 04/14/23. She is also followed by Dr. Allena Katz from [facility] where she was seen 11/18/22 and with whom [facility] notes indicate she has a future visit on 11/23/23. No additional health equity team support indicated at this time.

## 2023-08-02 ENCOUNTER — Other Ambulatory Visit: Payer: Self-pay | Admitting: Neurology

## 2023-08-07 ENCOUNTER — Telehealth: Payer: Self-pay | Admitting: Neurology

## 2023-08-07 NOTE — Telephone Encounter (Signed)
Sent to [facility] Status: Receipt confirmed by pharmacy (08/03/2023 11:05 AM EDT).  Called patient and informed her that her prescriptions were sent on 10/70/24 and to give [place] a call if there are any issues.

## 2023-08-07 NOTE — Telephone Encounter (Signed)
1. Which medications need refilled? (List name and dosage, if known) duloxetine 30 & 60 mg  2. Which pharmacy/location is medication to be sent to? (include street and city if local pharmacy) [facility]

## 2023-08-17 DIAGNOSIS — E119 Type 2 diabetes mellitus without complications: Secondary | ICD-10-CM | POA: Diagnosis not present

## 2023-09-30 DIAGNOSIS — M159 Polyosteoarthritis, unspecified: Secondary | ICD-10-CM | POA: Diagnosis not present

## 2023-09-30 DIAGNOSIS — E1142 Type 2 diabetes mellitus with diabetic polyneuropathy: Secondary | ICD-10-CM | POA: Diagnosis not present

## 2023-09-30 DIAGNOSIS — D860 Sarcoidosis of lung: Secondary | ICD-10-CM | POA: Diagnosis not present

## 2023-09-30 DIAGNOSIS — M5030 Other cervical disc degeneration, unspecified cervical region: Secondary | ICD-10-CM | POA: Diagnosis not present

## 2023-09-30 DIAGNOSIS — I7090 Unspecified atherosclerosis: Secondary | ICD-10-CM | POA: Diagnosis not present

## 2023-09-30 DIAGNOSIS — E1165 Type 2 diabetes mellitus with hyperglycemia: Secondary | ICD-10-CM | POA: Diagnosis not present

## 2023-09-30 DIAGNOSIS — E1122 Type 2 diabetes mellitus with diabetic chronic kidney disease: Secondary | ICD-10-CM | POA: Diagnosis not present

## 2023-09-30 DIAGNOSIS — E785 Hyperlipidemia, unspecified: Secondary | ICD-10-CM | POA: Diagnosis not present

## 2023-09-30 DIAGNOSIS — I10 Essential (primary) hypertension: Secondary | ICD-10-CM | POA: Diagnosis not present

## 2023-11-23 ENCOUNTER — Ambulatory Visit: Payer: Medicare Other | Admitting: Neurology

## 2023-11-23 VITALS — BP 137/66 | HR 89 | Ht 67.0 in | Wt 225.0 lb

## 2023-11-23 DIAGNOSIS — G629 Polyneuropathy, unspecified: Secondary | ICD-10-CM | POA: Diagnosis not present

## 2023-11-23 NOTE — Progress Notes (Signed)
Follow-up Visit   Date: 11/23/23   Theresa Moses MRN: [id number] DOB: January 12, 1941   Interim History: Theresa Moses is a 83 y.o. right-handed female with very well controlled type 2 diabetes mellitus, pulmonary sarcoidosis, hyperlipidemia, hypertension, osteoarthritis, and chronic low back pain returning to the clinic for follow-up of neuropathy.  The patient was accompanied to the clinic by self.  IMPRESSION/PLAN: Diabetic neuropathy manifesting with distal paresthesias.  Pain is mostly controlled on cymbalta 90mg  daily and she also gets relief with vicks vaborub.  Diabetes is well-controlled, last HbA1c 6.5.  She previously tried gabapentin (no benefit).  I will keep her on Cymbalta 90mg  daily.   Return to clinic in 1 year  --------------------------------------------------------------------- History of present illness: Starting around early 2020, she began noticing numbness and tingling over the toes and top of the feet.  Symptoms are constant, without any exacerbating.  She has noticed that socks at bedtime helps.  She tried gabapentin but it did not provide any relief.  She denies imbalance, falls, or weakness.  No numbness/tingling in the hands.    UPDATE 11/23/2023:  She is here for follow-up visit.  She takes Cymbalta 90mg  daily and applies vicks vaporub to her feet several times per day which helps.  Numbness is unchanged.  Symptoms remains in the feet.  No interval falls.  HbA1c 6.5.  She uses a cane for balance because of right knee pain.  Overall, symptoms are stable without any worsening.    Medications:  Current Outpatient Medications on File Prior to Visit  Medication Sig Dispense Refill   aspirin EC 81 MG tablet Take 81 mg by mouth in the morning. Swallow whole.     atorvastatin (LIPITOR) 40 MG tablet Take 40 mg by mouth every evening.     Calcium Carb-Cholecalciferol (CALCIUM PLUS VITAMIN D3 PO) Take 1 tablet by mouth in the morning.     cetirizine (ZYRTEC) 10 MG  tablet Take 10 mg by mouth at bedtime.     Coenzyme Q10-Vitamin E (QUNOL ULTRA COQ10 PO) Take 1 capsule by mouth every evening.     cyanocobalamin 500 MCG tablet Take 500 mcg by mouth daily.     dapagliflozin propanediol (FARXIGA) 10 MG TABS tablet 1 tablet Orally Once a day for 90 days     DULoxetine (CYMBALTA) 30 MG capsule TAKE 1 CAPSULE BY MOUTH AT  BEDTIME 90 capsule 3   DULoxetine (CYMBALTA) 60 MG capsule TAKE 1 CAPSULE BY MOUTH AT  BEDTIME 90 capsule 3   fluticasone (FLONASE) 50 MCG/ACT nasal spray Place 2 sprays into both nostrils daily as needed for allergies or rhinitis.     Garlic (GARLIQUE PO) Take 1 capsule by mouth daily.     glucosamine-chondroitin 500-400 MG tablet Take 1 tablet by mouth in the morning.     hydrochlorothiazide (MICROZIDE) 12.5 MG capsule Take 12.5 mg by mouth in the morning.     methocarbamol (ROBAXIN) 500 MG tablet Take 500-1,000 mg by mouth every 6 (six) hours as needed for spasms.     montelukast (SINGULAIR) 10 MG tablet Take 10 mg by mouth at bedtime.     Multiple Vitamins-Minerals (CENTRUM SILVER PO) Take 1 tablet by mouth in the morning.     olmesartan (BENICAR) 20 MG tablet Take 20 mg by mouth in the morning.     Omega-3 Fatty Acids (FISH OIL PO) Take 1,200 mg by mouth in the morning.     Polyethyl Glycol-Propyl Glycol (SYSTANE) 0.4-0.3 % SOLN Place  1-2 drops into both eyes 3 (three) times daily as needed (dry/irritated eyes.).     polyethylene glycol (MIRALAX / GLYCOLAX) 17 g packet Take 17 g by mouth daily as needed (constipation.).     psyllium (METAMUCIL) 58.6 % packet Take 1 packet by mouth in the morning and at bedtime.     No current facility-administered medications on file prior to visit.    Allergies:  Allergies  Allergen Reactions   Latex Hives   Neosporin [Neomycin-Polymyxin-Gramicidin] Hives    Vital Signs:  BP 137/66   Pulse 89   Ht 5\' 7"  (1.702 m)   Wt 225 lb (102.1 kg)   SpO2 97%   BMI 35.24 kg/m   Neurological  Exam: MENTAL STATUS including orientation to time, place, person, recent and remote memory, attention span and concentration, language, and fund of knowledge is normal.  Speech is not dysarthric.  CRANIAL NERVES:   Face is symmetric.    MOTOR:  Motor strength is 5/5 in all extremities, including distally.  No atrophy, fasciculations or abnormal movements.  No pronator drift.  Tone is normal.    MSRs:  Reflexes are 2+/4 throughout, except absent at the ankles bilaterally.  SENSORY: Vibration is reduced at the great toe bilaterally, intact at the ankles.    COORDINATION/GAIT: Gait is mildly wide-based, assisted with cane.  Data: n/a    Thank you for allowing me to participate in patient's care.  If I can answer any additional questions, I would be pleased to do so.    Sincerely,    Craig Wisnewski K. Allena Katz, DO

## 2023-12-29 DIAGNOSIS — H6121 Impacted cerumen, right ear: Secondary | ICD-10-CM | POA: Diagnosis not present

## 2023-12-29 DIAGNOSIS — E1122 Type 2 diabetes mellitus with diabetic chronic kidney disease: Secondary | ICD-10-CM | POA: Diagnosis not present

## 2023-12-29 DIAGNOSIS — E1165 Type 2 diabetes mellitus with hyperglycemia: Secondary | ICD-10-CM | POA: Diagnosis not present

## 2023-12-29 DIAGNOSIS — Z85038 Personal history of other malignant neoplasm of large intestine: Secondary | ICD-10-CM | POA: Diagnosis not present

## 2023-12-29 DIAGNOSIS — Z0000 Encounter for general adult medical examination without abnormal findings: Secondary | ICD-10-CM | POA: Diagnosis not present

## 2023-12-29 DIAGNOSIS — E785 Hyperlipidemia, unspecified: Secondary | ICD-10-CM | POA: Diagnosis not present

## 2023-12-29 DIAGNOSIS — E1142 Type 2 diabetes mellitus with diabetic polyneuropathy: Secondary | ICD-10-CM | POA: Diagnosis not present

## 2023-12-29 DIAGNOSIS — D860 Sarcoidosis of lung: Secondary | ICD-10-CM | POA: Diagnosis not present

## 2023-12-29 DIAGNOSIS — I10 Essential (primary) hypertension: Secondary | ICD-10-CM | POA: Diagnosis not present

## 2023-12-29 DIAGNOSIS — M5030 Other cervical disc degeneration, unspecified cervical region: Secondary | ICD-10-CM | POA: Diagnosis not present

## 2024-03-01 DIAGNOSIS — Z1231 Encounter for screening mammogram for malignant neoplasm of breast: Secondary | ICD-10-CM | POA: Diagnosis not present

## 2024-04-25 DIAGNOSIS — E1169 Type 2 diabetes mellitus with other specified complication: Secondary | ICD-10-CM | POA: Diagnosis not present

## 2024-04-25 DIAGNOSIS — I10 Essential (primary) hypertension: Secondary | ICD-10-CM | POA: Diagnosis not present

## 2024-04-25 DIAGNOSIS — M159 Polyosteoarthritis, unspecified: Secondary | ICD-10-CM | POA: Diagnosis not present

## 2024-05-09 ENCOUNTER — Encounter: Payer: Self-pay | Admitting: Neurology

## 2024-05-09 ENCOUNTER — Ambulatory Visit: Admitting: Neurology

## 2024-05-09 VITALS — BP 105/61 | HR 84 | Ht 67.0 in | Wt 224.0 lb

## 2024-05-09 DIAGNOSIS — G629 Polyneuropathy, unspecified: Secondary | ICD-10-CM

## 2024-05-09 DIAGNOSIS — E1142 Type 2 diabetes mellitus with diabetic polyneuropathy: Secondary | ICD-10-CM

## 2024-05-09 MED ORDER — PREGABALIN 50 MG PO CAPS: 50.0000 mg | ORAL_CAPSULE | Freq: Two times a day (BID) | ORAL | 5 refills | Status: DC

## 2024-05-09 NOTE — Progress Notes (Signed)
 Follow-up Visit   Date: 05/09/24   DANIELLA DEWBERRY MRN: [id number] DOB: 1941-01-31   Interim History: Theresa Moses is a 83 y.o. right-handed female with very well controlled type 2 diabetes mellitus, pulmonary sarcoidosis, hyperlipidemia, hypertension, osteoarthritis, and chronic low back pain returning to the clinic for follow-up of neuropathy.  The patient was accompanied to the clinic by self.  IMPRESSION/PLAN: Diabetic neuropathy manifesting with distal paresthesias. Pain is getting worse and would like to try different options.  She has previously been on gabapentin (no benefit).  She continues to get relief with vicks vaporub.  HbA1c 6.5. - Start pregabalin  50mg  twice daily - Reduce duloxetine  to 60mg  daily  Return to clinic in 6 months  --------------------------------------------------------------------- History of present illness: Starting around early 2020, she began noticing numbness and tingling over the toes and top of the feet.  Symptoms are constant, without any exacerbating.  She has noticed that socks at bedtime helps.  She tried gabapentin but it did not provide any relief.  She denies imbalance, falls, or weakness.  No numbness/tingling in the hands.    UPDATE 11/23/2023:  She is here for follow-up visit.  She takes Cymbalta  90mg  daily and applies vicks vaporub to her feet several times per day which helps.  Numbness is unchanged.  Symptoms remains in the feet.  No interval falls.  HbA1c 6.5.  She uses a cane for balance because of right knee pain.  Overall, symptoms are stable without any worsening.   UPDATE 05/09/2024:  She is here for follow-up visit.  She is having more painful tingling and burning in the feet and would like to try something different.  She is on cymbalta  90mg  but does not have any significant relief.  She continues to have the most relief with Vicks Vaporub.   Medications:  Current Outpatient Medications on File Prior to Visit  Medication Sig  Dispense Refill   aspirin EC 81 MG tablet Take 81 mg by mouth in the morning. Swallow whole.     atorvastatin (LIPITOR) 40 MG tablet Take 40 mg by mouth every evening.     Calcium Carb-Cholecalciferol (CALCIUM PLUS VITAMIN D3 PO) Take 1 tablet by mouth in the morning.     cetirizine (ZYRTEC) 10 MG tablet Take 10 mg by mouth at bedtime.     Coenzyme Q10-Vitamin E (QUNOL ULTRA COQ10 PO) Take 1 capsule by mouth every evening.     cyanocobalamin 500 MCG tablet Take 500 mcg by mouth daily.     dapagliflozin propanediol (FARXIGA) 10 MG TABS tablet 1 tablet Orally Once a day for 90 days     DULoxetine  (CYMBALTA ) 30 MG capsule TAKE 1 CAPSULE BY MOUTH AT  BEDTIME 90 capsule 3   DULoxetine  (CYMBALTA ) 60 MG capsule TAKE 1 CAPSULE BY MOUTH AT  BEDTIME 90 capsule 3   fluticasone (FLONASE) 50 MCG/ACT nasal spray Place 2 sprays into both nostrils daily as needed for allergies or rhinitis.     Garlic (GARLIQUE PO) Take 1 capsule by mouth daily.     glucosamine-chondroitin 500-400 MG tablet Take 1 tablet by mouth in the morning.     hydrochlorothiazide (MICROZIDE) 12.5 MG capsule Take 12.5 mg by mouth in the morning.     methocarbamol  (ROBAXIN ) 500 MG tablet Take 500-1,000 mg by mouth every 6 (six) hours as needed for spasms.     montelukast (SINGULAIR) 10 MG tablet Take 10 mg by mouth at bedtime.     Multiple Vitamins-Minerals (CENTRUM SILVER  PO) Take 1 tablet by mouth in the morning.     olmesartan (BENICAR) 20 MG tablet Take 20 mg by mouth in the morning.     Omega-3 Fatty Acids (FISH OIL PO) Take 1,200 mg by mouth in the morning.     Polyethyl Glycol-Propyl Glycol (SYSTANE) 0.4-0.3 % SOLN Place 1-2 drops into both eyes 3 (three) times daily as needed (dry/irritated eyes.).     polyethylene glycol (MIRALAX / GLYCOLAX) 17 g packet Take 17 g by mouth daily as needed (constipation.).     psyllium (METAMUCIL) 58.6 % packet Take 1 packet by mouth in the morning and at bedtime.     No current  facility-administered medications on file prior to visit.    Allergies:  Allergies  Allergen Reactions   Latex Hives   Neosporin [Neomycin-Polymyxin-Gramicidin] Hives    Vital Signs:  BP 105/61   Pulse 84   Ht 5' 7 (1.702 m)   Wt 224 lb (101.6 kg)   SpO2 92%   BMI 35.08 kg/m   Neurological Exam: MENTAL STATUS including orientation to time, place, person, recent and remote memory, attention span and concentration, language, and fund of knowledge is normal.  Speech is not dysarthric.  CRANIAL NERVES:   Face is symmetric.    MOTOR:  Motor strength is 5/5 in all extremities, including distally.  No atrophy, fasciculations or abnormal movements.  No pronator drift.  Tone is normal.    MSRs:  Reflexes are 2+/4 throughout, except absent at the ankles bilaterally.  SENSORY: Vibration is reduced at the great toe bilaterally, intact at the ankles.    COORDINATION/GAIT: Gait is mildly wide-based, unassisted  Data: Lab Results  Component Value Date   HGBA1C 6.5 05/16/2023      Thank you for allowing me to participate in patient's care.  If I can answer any additional questions, I would be pleased to do so.    Sincerely,    Lorinda Copland K. Tobie, DO

## 2024-05-09 NOTE — Patient Instructions (Addendum)
 Reduce duloxetine  to 60mg  daily  Start pregabalin  50mg  twice daily  Call with an update in 2 months, so we can decide how to reduce duloxetine  further

## 2024-05-26 DIAGNOSIS — I10 Essential (primary) hypertension: Secondary | ICD-10-CM | POA: Diagnosis not present

## 2024-05-26 DIAGNOSIS — E1169 Type 2 diabetes mellitus with other specified complication: Secondary | ICD-10-CM | POA: Diagnosis not present

## 2024-05-26 DIAGNOSIS — M159 Polyosteoarthritis, unspecified: Secondary | ICD-10-CM | POA: Diagnosis not present

## 2024-05-28 ENCOUNTER — Other Ambulatory Visit: Payer: Self-pay | Admitting: Neurology

## 2024-06-26 DIAGNOSIS — E1169 Type 2 diabetes mellitus with other specified complication: Secondary | ICD-10-CM | POA: Diagnosis not present

## 2024-06-26 DIAGNOSIS — I10 Essential (primary) hypertension: Secondary | ICD-10-CM | POA: Diagnosis not present

## 2024-06-26 DIAGNOSIS — M159 Polyosteoarthritis, unspecified: Secondary | ICD-10-CM | POA: Diagnosis not present

## 2024-06-28 DIAGNOSIS — E1142 Type 2 diabetes mellitus with diabetic polyneuropathy: Secondary | ICD-10-CM | POA: Diagnosis not present

## 2024-06-28 DIAGNOSIS — M159 Polyosteoarthritis, unspecified: Secondary | ICD-10-CM | POA: Diagnosis not present

## 2024-06-28 DIAGNOSIS — I10 Essential (primary) hypertension: Secondary | ICD-10-CM | POA: Diagnosis not present

## 2024-06-28 DIAGNOSIS — E1169 Type 2 diabetes mellitus with other specified complication: Secondary | ICD-10-CM | POA: Diagnosis not present

## 2024-06-28 DIAGNOSIS — E1122 Type 2 diabetes mellitus with diabetic chronic kidney disease: Secondary | ICD-10-CM | POA: Diagnosis not present

## 2024-06-28 DIAGNOSIS — J309 Allergic rhinitis, unspecified: Secondary | ICD-10-CM | POA: Diagnosis not present

## 2024-06-28 DIAGNOSIS — E785 Hyperlipidemia, unspecified: Secondary | ICD-10-CM | POA: Diagnosis not present

## 2024-06-28 DIAGNOSIS — Z85038 Personal history of other malignant neoplasm of large intestine: Secondary | ICD-10-CM | POA: Diagnosis not present

## 2024-06-28 DIAGNOSIS — E1165 Type 2 diabetes mellitus with hyperglycemia: Secondary | ICD-10-CM | POA: Diagnosis not present

## 2024-07-26 DIAGNOSIS — E1169 Type 2 diabetes mellitus with other specified complication: Secondary | ICD-10-CM | POA: Diagnosis not present

## 2024-07-26 DIAGNOSIS — M159 Polyosteoarthritis, unspecified: Secondary | ICD-10-CM | POA: Diagnosis not present

## 2024-07-26 DIAGNOSIS — I10 Essential (primary) hypertension: Secondary | ICD-10-CM | POA: Diagnosis not present

## 2024-08-22 DIAGNOSIS — E119 Type 2 diabetes mellitus without complications: Secondary | ICD-10-CM | POA: Diagnosis not present

## 2024-08-22 DIAGNOSIS — Z961 Presence of intraocular lens: Secondary | ICD-10-CM | POA: Diagnosis not present

## 2024-11-17 ENCOUNTER — Telehealth: Payer: Self-pay | Admitting: Neurology

## 2024-11-17 MED ORDER — PREGABALIN 75 MG PO CAPS: 75.0000 mg | ORAL_CAPSULE | Freq: Two times a day (BID) | ORAL | 5 refills | Status: AC

## 2024-11-17 NOTE — Telephone Encounter (Signed)
 Called patient and she informed me that Cymbalta  and Preamble is not working for her and she is having more tingling in her feet and legs. She stated that rubbing a vapor rub helps her sleep at night.  Patient is afraid she might have to cancel her appointment next week due to the snow. So she wanted to call and let Dr. Tobie know what is going on.   I informed patient that I will send her message to Dr. Tobie and give her a call back.

## 2024-11-17 NOTE — Telephone Encounter (Signed)
 Theresa Moses called in wanting to speak with the nurse regarding meds. She is stating that she doesn't want a refill for the pregabalin  nor the duloxetine .  She has requested a call back.

## 2024-11-17 NOTE — Telephone Encounter (Signed)
 Called patient and informed her of Dr. Anthony recommendations to increase her pregabalin . Patient stated that she will give it a try but it might cost too much because she hasn't met her insurance deductible. She stated to go ahead and send in the prescription and she will use the Good Rx card if she needs to. Patient verbalized understanding and had no further questions or concerns.

## 2024-11-17 NOTE — Telephone Encounter (Signed)
 She is on a very low dose of pregabalin .  I recommend increasing the dose to pregabalin  75mg  twice daily (daily maximum is 600mg /day).  If she would like to proceed with this, I will sent prescription.

## 2024-11-22 ENCOUNTER — Ambulatory Visit: Payer: Medicare Other | Admitting: Neurology

## 2025-01-17 ENCOUNTER — Ambulatory Visit: Admitting: Neurology
